# Patient Record
Sex: Female | Born: 1935 | Race: White | Hispanic: No | Smoking: Never smoker
Health system: Southern US, Community
[De-identification: ages and names within clinical notes are randomized; demographics above are authoritative.]

## PROBLEM LIST (undated history)

## (undated) DIAGNOSIS — I10 Essential (primary) hypertension: Secondary | ICD-10-CM

## (undated) DIAGNOSIS — M069 Rheumatoid arthritis, unspecified: Secondary | ICD-10-CM

## (undated) DIAGNOSIS — E039 Hypothyroidism, unspecified: Secondary | ICD-10-CM

## (undated) DIAGNOSIS — G252 Other specified forms of tremor: Secondary | ICD-10-CM

## (undated) HISTORY — DX: Rheumatoid arthritis, unspecified: M06.9

## (undated) HISTORY — DX: Essential (primary) hypertension: I10

## (undated) HISTORY — DX: Hypothyroidism, unspecified: E03.9

## (undated) HISTORY — DX: Other specified forms of tremor: G25.2

## (undated) HISTORY — PX: CATARACT EXTRACTION: SUR2

## (undated) HISTORY — PX: OTHER SURGICAL HISTORY: SHX169

## (undated) HISTORY — PX: DILATION AND CURETTAGE OF UTERUS: SHX78

---

## 2013-10-04 ENCOUNTER — Encounter: Payer: Self-pay | Admitting: Neurology

## 2013-10-04 ENCOUNTER — Ambulatory Visit (INDEPENDENT_AMBULATORY_CARE_PROVIDER_SITE_OTHER): Payer: Medicare Other | Admitting: Neurology

## 2013-10-04 ENCOUNTER — Encounter (INDEPENDENT_AMBULATORY_CARE_PROVIDER_SITE_OTHER): Payer: Self-pay

## 2013-10-04 VITALS — BP 160/71 | HR 74 | Ht 59.0 in | Wt 144.0 lb

## 2013-10-04 DIAGNOSIS — G252 Other specified forms of tremor: Secondary | ICD-10-CM

## 2013-10-04 DIAGNOSIS — G250 Essential tremor: Secondary | ICD-10-CM

## 2013-10-04 DIAGNOSIS — M069 Rheumatoid arthritis, unspecified: Secondary | ICD-10-CM

## 2013-10-04 NOTE — Progress Notes (Signed)
PATIENT: Virginia Wright DOB: [birth year]  HISTORICAL  Virginia Wright is accompanied by her son for followup for essential tremor I evaluated her in February 2014,   She is a 80 year old right-handed Caucasian female, accompanied by her son referred by her primary care physician Virginia Wright,  for evaluation of bilateral hands tremor  She has past medical history of rheumatoid arthritis, on chronic methotrexate 2.5 mg 2 tablets every week, hypothyroidism, on supplement, history of kidney stone,  She presenting with more than 5 years history of bilateral hands tremor, most noticeable when she writes, using utensil, holding a cup, she denied difficulty walking, denies lost sense of smell, no constipation, no REM sleep disorder, no orthostatic symptoms,   Her brother at age 103, also had similar symptoms,.her adult son is asymptomatic, her parents died at young age,  She is hypertensive, is taking amlodipine 10 mg every day, today's blood pressure is 142 / 80, HR 60   She suffered a kidney stone in 12/21/2011, now recovered well, she is tolerating propanolol 40 mg twice a day, reported mild improvement, she's sleeping well, no REM sleeping disorder, no loss of sense of smell, tremor bothers her when she writes, or holding a cup   She is taking propanolol 40 mg twice a day, doing very well, which does help her bilateral hands tremor mild to moderately, she has no gait difficulty, no loss sense of smell     REVIEW OF SYSTEMS: Full 14 system review of systems performed and notable only for bilateral hands tremor   ALLERGIES: No Known Allergies  HOME MEDICATIONS: No outpatient prescriptions prior to visit.   No facility-administered medications prior to visit.    PAST MEDICAL HISTORY: Past Medical History  Diagnosis Date  . Unspecified hypothyroidism   . Arthritis, rheumatoid   . Postural tremor   . High blood pressure     PAST SURGICAL HISTORY: Past Surgical History  Procedure  Laterality Date  . Dilation and curettage of uterus    . Kidney stones    . Cataract extraction Bilateral     FAMILY HISTORY: History reviewed. No pertinent family history.  SOCIAL HISTORY:  History   Social History  . Marital Status: Widowed    Spouse Name: N/A    Number of Children: 1  . Years of Education: 12   Occupational History  .      retired   Social History Main Topics  . Smoking status: Never Smoker   . Smokeless tobacco: Never Used  . Alcohol Use: No  . Drug Use: No  . Sexual Activity: Not on file   Other Topics Concern  . Not on file   Social History Narrative   Patient lives at home alone.   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   Generalized: In no acute distress  Neck: Supple, no carotid bruits   Cardiac: Regular rate rhythm  Pulmonary: Clear to auscultation bilaterally  Musculoskeletal: No deformity  Neurological examination  Mentation: Alert oriented to time, place, history taking, and causual conversation  Cranial nerve II-XII: Pupils were equal round reactive to light extraocular movements were full, Visual field were full on  confrontational test. Bilateral fundi were sharp.  Facial sensation and strength were normal. Hearing was intact to finger rubbing bilaterally. Uvula tongue midline.  head turning and shoulder shrug and were normal and symmetric.Tongue protrusion into cheek strength was normal.  Motor: normal tone, bulk and strength, with exception of mild bilateral hands pasture tremor, she also has no-no head shaking     Sensory: Intact to fine touch, pinprick, preserved vibratory sensation, and proprioception at toes.  Coordination: Normal finger to nose, heel-to-shin bilaterally there was no  truncal ataxia  Gait: Rising up from seated position without assistance, normal stance, without trunk ataxia, moderate stride, good arm swing, smooth turning, able to perform tiptoe, and heel walking without difficulty.   Romberg signs: Negative  Deep tendon reflexes: Brachioradialis 2/2, biceps 2/2, triceps 2/2, patellar 2/2, Achilles 2/2, plantar responses were flexor bilaterally.   DIAGNOSTIC DATA (LABS, IMAGING, TESTING) - I reviewed patient records, labs, notes, testing and imaging myself where available.   ASSESSMENT AND PLAN    80 years old right-handed Caucasian female, with family history of essential tremor, she also has mild bilateral hands shaking, head titubation, responding mild rapidly to propanolol 40 mg twice a day, her blood pressure is stable, there was no significant side effects, refuel propanolol, continual focal out with her primary care, on the return to primary care if new issue arise      Levert Feinstein, M.D. Ph.D.  [facility] [address] [place], [place] [address] [phone]

## 2013-11-04 ENCOUNTER — Other Ambulatory Visit: Payer: Self-pay | Admitting: Neurology

## 2016-07-02 ENCOUNTER — Encounter: Payer: Self-pay | Admitting: Oncology

## 2016-08-27 ENCOUNTER — Ambulatory Visit (HOSPITAL_BASED_OUTPATIENT_CLINIC_OR_DEPARTMENT_OTHER): Payer: Medicare Other | Admitting: Oncology

## 2016-08-27 ENCOUNTER — Encounter: Payer: Self-pay | Admitting: Oncology

## 2016-08-27 DIAGNOSIS — D696 Thrombocytopenia, unspecified: Secondary | ICD-10-CM

## 2016-08-27 DIAGNOSIS — M069 Rheumatoid arthritis, unspecified: Secondary | ICD-10-CM | POA: Diagnosis not present

## 2016-08-27 NOTE — Progress Notes (Signed)
Reason for Referral:  Thrombocytopenia.   HPI: 83 year old woman with history of rheumatoid arthritis and hypothyroidism but no other significant comorbid conditions. She has been dealing with rheumatoid arthritis for over 10 years and at some point she was on methotrexate. She has been off methotrexate given to her disease control and she was told that she is no longer requiring it. She was noted to have thrombocytopenia on a CBC in November 2017. At that time her white cell count was 4.7, hemoglobin 13.2 and a platelet count of 95. Previous CBCs dating back to 2014 has shown similar pattern. Platelet count has fluctuated between as low as 88 and as high as 115 with normal CBC otherwise. She did have a rash on her lower extremities that was treated by dermatology and no ecchymosis noted. She denies any bleeding complications. She denied any epistaxis, hematochezia or melena. She denied any constitutional symptoms of weight loss or appetite changes. She remains active and lives independently. She attends to activities of daily living including driving short distances. He not any falls or syncope.  She does not report any headaches, blurry vision, syncope or seizures. She does not report any fevers, chills, sweats or weight loss. She does not report any chest pain, palpitation, orthopnea or leg edema. She does not report any cough, wheezing or hemoptysis. She does not report any nausea, vomiting or abdominal pain. She does not report any abdominal distention or early satiety. She does not report any frequency urgency or hesitancy. She is not report any skeletal complaints of arthralgias or myalgias. Remaining review of systems unremarkable.   Past Medical History:  Diagnosis Date  . Arthritis, rheumatoid   . High blood pressure   . Postural tremor   . Unspecified hypothyroidism   :  Past Surgical History:  Procedure Laterality Date  . CATARACT EXTRACTION Bilateral   . DILATION AND CURETTAGE OF  UTERUS    . kidney stones    :   Current Outpatient Prescriptions:  .  albuterol (PROVENTIL HFA;VENTOLIN HFA) 108 (90 Base) MCG/ACT inhaler, Inhale 2 puffs into the lungs 4 (four) times daily as needed., Disp: , Rfl:  .  amLODipine (NORVASC) 10 MG tablet, 10 mg daily., Disp: , Rfl:  .  Calcium-Vitamin D 600-200 MG-UNIT tablet, Take 1 tablet by mouth daily., Disp: , Rfl:  .  folic acid (FOLVITE) 1 MG tablet, Take 1 tablet by mouth daily., Disp: , Rfl:  .  propranolol (INDERAL) 40 MG tablet, TAKE ONE TABLET BY MOUTH TWICE DAILY, Disp: 180 tablet, Rfl: 4 .  SYNTHROID 75 MCG tablet, Take 75 mcg by mouth daily., Disp: , Rfl: :  Allergies  Allergen Reactions  . Celecoxib Other (See Comments)    Other reaction(s): SWELLING/EDEMA  . Clarithromycin Other (See Comments)    Patient doesn't remember reaction  . Cefdinir Rash  . Codeine Nausea Only  . Gatifloxacin Other (See Comments)    Patient doesn't remember reaction  . Hydroxychloroquine Other (See Comments)    Patient doesn't remember reaction  :  No family history on file.:  Social History   Social History  . Marital status: Widowed    Spouse name: N/A  . Number of children: 1  . Years of education: 62   Occupational History  .      retired   Social History Main Topics  . Smoking status: Never Smoker  . Smokeless tobacco: Never Used  . Alcohol use No  . Drug use: No  . Sexual activity:  Not on file   Other Topics Concern  . Not on file   Social History Narrative   Patient lives at home alone.   Retired   Ambulance person school   Caffeine one cup of coffee daily   Right handed  :  Pertinent items are noted in HPI.  Exam: Blood pressure (!) 164/48, pulse (!) 55, temperature 97.6 F (36.4 C), temperature source Oral, resp. rate 17, height '4\' 11"'$  (1.499 m), weight 153 lb 1.6 oz (69.4 kg), SpO2 100 %.  ECOG 1 General appearance: alert and cooperative appeared without distress. Head: Normocephalic, without obvious  abnormality Throat: lips, mucosa, and tongue normal; teeth and gums normal no oral ulcers or ecchymosis. Neck: no adenopathy Back: negative Resp: clear to auscultation bilaterally Cardio: regular rate and rhythm, S1, S2 normal, no murmur, click, rub or gallop GI: soft, non-tender; bowel sounds normal; no masses,  no organomegaly Extremities: extremities normal, atraumatic, no cyanosis or edema Pulses: 2+ and symmetric    Assessment and Plan:   83 year old woman with the following issues:  1. Thrombocytopenia noted as far back as 2014. Her platelet count has fluctuated as low as 88,000 and as high as 115,000. She had a normal CBC otherwise. Her most recent CBC in November 2017 showed a platelet count of 95,000. She does have rheumatoid arthritis and had been on methotrexate although methotrexate has been discontinued and the platelets are not changed.  The differential diagnosis was reviewed today with the patient and her son. The most likely etiology in this setting would be autoimmune thrombocytopenia. Condition like ITP especially in the setting of autoimmune disorder such as rheumatoid arthritis would be reasonably common. Medication affect is also a consideration although her medication were reviewed today and she is no longer on methotrexate or any other agent that can cause thrombocytopenia.  Condition such as DIC, HUS, TTP, HIT considered less likely. Primary bone marrow disorder such as myelodysplastic syndrome would be a possibility but I think is less likely at this time.  Her management standpoint, given the fact that her platelet count is adequate without any bleeding symptoms and no intervention is needed at this time.  Of her platelet counts drop below 50,000, a trial of steroid may be needed and possibly a bone marrow biopsy at that time. For the time being, as long as her platelets remain at baseline I do not think any intervention is needed.  2. Follow-up: I'm happy to see  her in the future as needed specially if her platelet counts drop below 50,000 or she develops any other cytopenias.

## 2016-12-03 ENCOUNTER — Encounter: Payer: Self-pay | Admitting: *Deleted

## 2019-05-17 ENCOUNTER — Emergency Department (HOSPITAL_COMMUNITY): Payer: Medicare Other

## 2019-05-17 ENCOUNTER — Inpatient Hospital Stay (HOSPITAL_COMMUNITY)
Admission: EM | Admit: 2019-05-17 | Discharge: 2019-05-22 | DRG: 481 | Disposition: A | Discharge status: Skilled Nursing Facility | Payer: Medicare Other | Attending: Internal Medicine | Admitting: Internal Medicine

## 2019-05-17 DIAGNOSIS — E559 Vitamin D deficiency, unspecified: Secondary | ICD-10-CM | POA: Diagnosis present

## 2019-05-17 DIAGNOSIS — Z79899 Other long term (current) drug therapy: Secondary | ICD-10-CM | POA: Diagnosis not present

## 2019-05-17 DIAGNOSIS — Z79890 Hormone replacement therapy (postmenopausal): Secondary | ICD-10-CM | POA: Diagnosis not present

## 2019-05-17 DIAGNOSIS — S72142A Displaced intertrochanteric fracture of left femur, initial encounter for closed fracture: Principal | ICD-10-CM | POA: Diagnosis present

## 2019-05-17 DIAGNOSIS — S72002A Fracture of unspecified part of neck of left femur, initial encounter for closed fracture: Secondary | ICD-10-CM | POA: Diagnosis not present

## 2019-05-17 DIAGNOSIS — D696 Thrombocytopenia, unspecified: Secondary | ICD-10-CM | POA: Diagnosis present

## 2019-05-17 DIAGNOSIS — E8889 Other specified metabolic disorders: Secondary | ICD-10-CM | POA: Diagnosis present

## 2019-05-17 DIAGNOSIS — M25552 Pain in left hip: Secondary | ICD-10-CM | POA: Diagnosis present

## 2019-05-17 DIAGNOSIS — E039 Hypothyroidism, unspecified: Secondary | ICD-10-CM | POA: Diagnosis present

## 2019-05-17 DIAGNOSIS — N179 Acute kidney failure, unspecified: Secondary | ICD-10-CM | POA: Diagnosis present

## 2019-05-17 DIAGNOSIS — Z419 Encounter for procedure for purposes other than remedying health state, unspecified: Secondary | ICD-10-CM

## 2019-05-17 DIAGNOSIS — Y93H2 Activity, gardening and landscaping: Secondary | ICD-10-CM

## 2019-05-17 DIAGNOSIS — S7222XA Displaced subtrochanteric fracture of left femur, initial encounter for closed fracture: Secondary | ICD-10-CM | POA: Diagnosis present

## 2019-05-17 DIAGNOSIS — K5900 Constipation, unspecified: Secondary | ICD-10-CM | POA: Diagnosis not present

## 2019-05-17 DIAGNOSIS — E875 Hyperkalemia: Secondary | ICD-10-CM | POA: Diagnosis present

## 2019-05-17 DIAGNOSIS — S72009A Fracture of unspecified part of neck of unspecified femur, initial encounter for closed fracture: Secondary | ICD-10-CM | POA: Diagnosis present

## 2019-05-17 DIAGNOSIS — Y92017 Garden or yard in single-family (private) house as the place of occurrence of the external cause: Secondary | ICD-10-CM | POA: Diagnosis not present

## 2019-05-17 DIAGNOSIS — W1839XA Other fall on same level, initial encounter: Secondary | ICD-10-CM | POA: Diagnosis present

## 2019-05-17 DIAGNOSIS — Z20828 Contact with and (suspected) exposure to other viral communicable diseases: Secondary | ICD-10-CM | POA: Diagnosis present

## 2019-05-17 DIAGNOSIS — M069 Rheumatoid arthritis, unspecified: Secondary | ICD-10-CM | POA: Diagnosis present

## 2019-05-17 DIAGNOSIS — I10 Essential (primary) hypertension: Secondary | ICD-10-CM

## 2019-05-17 DIAGNOSIS — D62 Acute posthemorrhagic anemia: Secondary | ICD-10-CM | POA: Diagnosis not present

## 2019-05-17 DIAGNOSIS — R7989 Other specified abnormal findings of blood chemistry: Secondary | ICD-10-CM

## 2019-05-17 LAB — CBC WITH DIFFERENTIAL/PLATELET
Abs Immature Granulocytes: 0.03 10*3/uL (ref 0.00–0.07)
Basophils Absolute: 0 10*3/uL (ref 0.0–0.1)
Basophils Relative: 0 %
Eosinophils Absolute: 0 10*3/uL (ref 0.0–0.5)
Eosinophils Relative: 0 %
HCT: 37.2 % (ref 36.0–46.0)
Hemoglobin: 13.1 g/dL (ref 12.0–15.0)
Immature Granulocytes: 1 %
Lymphocytes Relative: 10 %
Lymphs Abs: 0.7 10*3/uL (ref 0.7–4.0)
MCH: 37.6 pg — ABNORMAL HIGH (ref 26.0–34.0)
MCHC: 35.2 g/dL (ref 30.0–36.0)
MCV: 106.9 fL — ABNORMAL HIGH (ref 80.0–100.0)
Monocytes Absolute: 0.7 10*3/uL (ref 0.1–1.0)
Monocytes Relative: 12 %
Neutro Abs: 4.9 10*3/uL (ref 1.7–7.7)
Neutrophils Relative %: 77 %
Platelets: 104 10*3/uL — ABNORMAL LOW (ref 150–400)
RBC: 3.48 MIL/uL — ABNORMAL LOW (ref 3.87–5.11)
RDW: 14.4 % (ref 11.5–15.5)
WBC: 6.3 10*3/uL (ref 4.0–10.5)
nRBC: 0 % (ref 0.0–0.2)

## 2019-05-17 LAB — COMPREHENSIVE METABOLIC PANEL
ALT: 19 U/L (ref 0–44)
AST: 19 U/L (ref 15–41)
Albumin: 4.1 g/dL (ref 3.5–5.0)
Alkaline Phosphatase: 49 U/L (ref 38–126)
Anion gap: 10 (ref 5–15)
BUN: 27 mg/dL — ABNORMAL HIGH (ref 8–23)
CO2: 22 mmol/L (ref 22–32)
Calcium: 9 mg/dL (ref 8.9–10.3)
Chloride: 106 mmol/L (ref 98–111)
Creatinine, Ser: 1.23 mg/dL — ABNORMAL HIGH (ref 0.44–1.00)
GFR calc Af Amer: 47 mL/min — ABNORMAL LOW (ref >60)
GFR calc non Af Amer: 40 mL/min — ABNORMAL LOW (ref >60)
Glucose, Bld: 158 mg/dL — ABNORMAL HIGH (ref 70–99)
Potassium: 5.2 mmol/L — ABNORMAL HIGH (ref 3.5–5.1)
Sodium: 138 mmol/L (ref 135–145)
Total Bilirubin: 1.4 mg/dL — ABNORMAL HIGH (ref 0.3–1.2)
Total Protein: 6.4 g/dL — ABNORMAL LOW (ref 6.5–8.1)

## 2019-05-17 LAB — PROTIME-INR
INR: 1 (ref 0.8–1.2)
Prothrombin Time: 13.5 seconds (ref 11.4–15.2)

## 2019-05-17 MED ORDER — ONDANSETRON HCL 4 MG/2ML IJ SOLN
4.0000 mg | Freq: Once | INTRAMUSCULAR | Status: AC
  Administered 2019-05-17: 4 mg via INTRAVENOUS
  Filled 2019-05-17: qty 2

## 2019-05-17 MED ORDER — MORPHINE SULFATE (PF) 4 MG/ML IV SOLN
4.0000 mg | Freq: Once | INTRAVENOUS | Status: AC
  Administered 2019-05-17: 4 mg via INTRAVENOUS
  Filled 2019-05-17: qty 1

## 2019-05-17 NOTE — Consult Note (Signed)
Reason for Consult: Left hip pain Referring Physician: Dr. Lillette Boxer Bevard is an 85 y.o. female.  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  She had a mechanical fall tonight and reports left hip pain.  Denies any other orthopedic complaints.  Denies any loss of consciousness.  Her son is with her here.  Past Medical History:  Diagnosis Date  . Arthritis, rheumatoid ([place])   . High blood pressure   . Postural tremor   . Rheumatoid arthritis ([place])   . Unspecified hypothyroidism     Past Surgical History:  Procedure Laterality Date  . CATARACT EXTRACTION Bilateral   . DILATION AND CURETTAGE OF UTERUS    . kidney stones      No family history on file.  Social History:  reports that she has never smoked. She has never used smokeless tobacco. She reports that she does not drink alcohol or use drugs.  Allergies:  Allergies  Allergen Reactions  . Celecoxib Other (See Comments)    Other reaction(s): SWELLING/EDEMA  . Clarithromycin Other (See Comments)    Patient doesn't remember reaction  . Cefdinir Rash  . Codeine Nausea Only  . Gatifloxacin Other (See Comments)    Patient doesn't remember reaction  . Hydroxychloroquine Other (See Comments)    Patient doesn't remember reaction    Medications: I have reviewed the patient's current medications.  Results for orders placed or performed during the hospital encounter of 05/17/19 (from the past 48 hour(s))  Comprehensive metabolic panel     Status: Abnormal   Collection Time: 05/17/19  9:01 PM  Result Value Ref Range   Sodium 138 135 - 145 mmol/L   Potassium 5.2 (H) 3.5 - 5.1 mmol/L   Chloride 106 98 - 111 mmol/L   CO2 22 22 - 32 mmol/L   Glucose, Bld 158 (H) 70 - 99 mg/dL   BUN 27 (H) 8 - 23 mg/dL   Creatinine, Ser 1.23 (H) 0.44 - 1.00 mg/dL   Calcium 9.0 8.9 - 10.3 mg/dL   Total Protein 6.4 (L) 6.5 - 8.1 g/dL   Albumin 4.1 3.5 - 5.0 g/dL   AST 19 15 - 41 U/L   ALT 19 0 - 44  U/L   Alkaline Phosphatase 49 38 - 126 U/L   Total Bilirubin 1.4 (H) 0.3 - 1.2 mg/dL   GFR calc non Af Amer 40 (L) >60 mL/min   GFR calc Af Amer 47 (L) >60 mL/min   Anion gap 10 5 - 15    Comment: Performed at [facility], [address], [place], [place] [address]  CBC with Differential     Status: Abnormal   Collection Time: 05/17/19  9:01 PM  Result Value Ref Range   WBC 6.3 4.0 - 10.5 K/uL   RBC 3.48 (L) 3.87 - 5.11 MIL/uL   Hemoglobin 13.1 12.0 - 15.0 g/dL   HCT 37.2 36.0 - 46.0 %   MCV 106.9 (H) 80.0 - 100.0 fL   MCH 37.6 (H) 26.0 - 34.0 pg   MCHC 35.2 30.0 - 36.0 g/dL   RDW 14.4 11.5 - 15.5 %   Platelets 104 (L) 150 - 400 K/uL    Comment: REPEATED TO VERIFY PLATELET COUNT CONFIRMED BY SMEAR Immature Platelet Fraction may be clinically indicated, consider ordering this additional test [id number]    nRBC 0.0 0.0 - 0.2 %   Neutrophils Relative % 77 %   Neutro Abs 4.9 1.7 -  7.7 K/uL   Lymphocytes Relative 10 %   Lymphs Abs 0.7 0.7 - 4.0 K/uL   Monocytes Relative 12 %   Monocytes Absolute 0.7 0.1 - 1.0 K/uL   Eosinophils Relative 0 %   Eosinophils Absolute 0.0 0.0 - 0.5 K/uL   Basophils Relative 0 %   Basophils Absolute 0.0 0.0 - 0.1 K/uL   Immature Granulocytes 1 %   Abs Immature Granulocytes 0.03 0.00 - 0.07 K/uL    Comment: Performed at [facility], [address], [place], [place] [address]  Protime-INR     Status: None   Collection Time: 05/17/19  9:01 PM  Result Value Ref Range   Prothrombin Time 13.5 11.4 - 15.2 seconds   INR 1.0 0.8 - 1.2    Comment: (NOTE) INR goal varies based on device and disease states. Performed at [facility], [address], [place], [place] [address]     Dg Chest 1 View  Result Date: 05/17/2019 CLINICAL DATA:  Fall with left hip fracture. EXAM: CHEST  1 VIEW COMPARISON:  Report from radiograph 08/31/2018, images not available. FINDINGS: The cardiomediastinal contours are unchanged. Stable borderline  cardiomegaly. Pulmonary vasculature is normal. No consolidation, pleural effusion, or pneumothorax. No acute osseous abnormalities are seen. IMPRESSION: 1. No acute abnormality. 2. Borderline cardiomegaly. Electronically Signed   By: Narda Rutherford M.D.   On: 05/17/2019 20:47   Dg Hip Unilat W Or Wo Pelvis 2-3 Views Left  Result Date: 05/17/2019 CLINICAL DATA:  Left hip pain after mechanical fall today. EXAM: DG HIP (WITH OR WITHOUT PELVIS) 2-3V LEFT COMPARISON:  None. FINDINGS: Comminuted and displaced left proximal femur fracture involving the lesser trochanter and subtrochanteric femur. There is apex lateral angulation and mild proximal migration of the femoral shaft. Femoral head remains seated. Pubic rami are intact. Degenerative change of the pubic symphysis and sacroiliac joints. IMPRESSION: Comminuted and displaced left proximal femur fracture involving the lesser trochanter and subtrochanteric femur. Electronically Signed   By: Narda Rutherford M.D.   On: 05/17/2019 20:45    Review of Systems  Musculoskeletal: Positive for joint pain.  All other systems reviewed and are negative.  Blood pressure 113/65, pulse 74, temperature (!) 97.5 F (36.4 C), temperature source Oral, resp. rate 16, SpO2 98 %. Physical Exam  Constitutional: She appears well-developed.  HENT:  Head: Normocephalic.  Eyes: Pupils are equal, round, and reactive to light.  Neck: Normal range of motion.  Cardiovascular: Normal rate.  Respiratory: Effort normal.  Neurological: She is alert.  Skin: Skin is warm.  Psychiatric: She has a normal mood and affect.  Examination of bilateral upper extremities demonstrates good range of motion wrist elbows and shoulders bilaterally.  Right lower extremity no effusion in the knee and good ankle dorsiflexion plantarflexion strength with no groin pain with internal and external rotation.  Left leg is held slightly externally rotated and flexed but has good fetal pulses and  perfusion to the left foot with good ankle dorsiflexion plantarflexion strength.  Assessment/Plan: Impression is subtrochanteric femur fracture on the left.  Plan is left femur fracture intramedullary nailing.  Dr. Carola Frost has time available tomorrow and can do this sooner than I would be able to.  Plan for medical admission tonight and surgery tomorrow. Marrianne Mood Charbel Los 05/17/2019, 10:49 PM

## 2019-05-17 NOTE — ED Triage Notes (Signed)
Pt BIB [facility] following a mechanical ground level fall that occurred this afternoon. Per EMS patient had no loss of consciousness and is complaining of left thigh and hip pain. EMS reports patient leg is shortened and did palpate deformities, does have pedal pulses.  EMS meds given: 4mg  morphine 1mg  dilaudid 4mg  zofran 25 mg phenergan

## 2019-05-17 NOTE — H&P (Signed)
History and Physical    Virginia Wright HYW:[id number] DOB: [birth year] DOA: 05/17/2019  PCP: Chilton Greathouse, NP  Chief Complaint: Fall  HPI: Virginia Wright is a 85 y.o. female with medical history significant of rheumatoid arthritis, chronic thrombocytopenia, hypertension, hypothyroidism presenting to the hospital via EMS for evaluation of left hip and thigh pain after a fall.  Patient states that she was in her usual state of health and fell while doing gardening in her yard.  She was pulling on a honeysuckle wine and it broke which made her fall.  Since the fall she is having pain in her left hip.  Denies any head injury or neck pain.  Denies any injury anywhere else.  Denies chest pain, palpitations, shortness of breath, or lightheadedness/dizziness at the time of the fall.  No other complaints.  ED Course: Vital signs stable.  No leukocytosis.  No anemia.  Platelet count 104,000, has a history of chronic thrombocytopenia.  Potassium 5.2.  BUN 27, creatinine 1.2.  Baseline creatinine 0.9-1.0.  T bili 1.4, remainder of LFTs normal.   T bili was previously normal on labs done 9/15 per care everywhere.  SARS-CoV-2 test pending.  Chest x-ray showing borderline cardiomegaly and no acute abnormality.  X-ray of left hip and pelvis showing comminuted and displaced left proximal femur fracture involving the lesser trochanter and subtrochanteric femur.  Review of Systems:  All systems reviewed and apart from history of presenting illness, are negative.  Past Medical History:  Diagnosis Date   Arthritis, rheumatoid (HCC)    High blood pressure    Postural tremor    Rheumatoid arthritis (HCC)    Unspecified hypothyroidism     Past Surgical History:  Procedure Laterality Date   CATARACT EXTRACTION Bilateral    DILATION AND CURETTAGE OF UTERUS     kidney stones       reports that she has never smoked. She has never used smokeless tobacco. She reports that she does not drink alcohol or use  drugs.  Allergies  Allergen Reactions   Celecoxib Other (See Comments)    Other reaction(s): SWELLING/EDEMA   Clarithromycin Other (See Comments)    Patient doesn't remember reaction   Cefdinir Rash   Codeine Nausea Only   Gatifloxacin Other (See Comments)    Patient doesn't remember reaction   Hydroxychloroquine Other (See Comments)    Patient doesn't remember reaction    No family history on file.  Prior to Admission medications   Medication Sig Start Date End Date Taking? Authorizing Provider  albuterol (PROVENTIL HFA;VENTOLIN HFA) 108 (90 Base) MCG/ACT inhaler Inhale 2 puffs into the lungs 4 (four) times daily as needed. 01/08/16 01/07/17  [provider]  amLODipine (NORVASC) 10 MG tablet 10 mg daily. 09/30/13   [provider]  Calcium-Vitamin D 600-200 MG-UNIT tablet Take 1 tablet by mouth daily. 11/02/09   [provider]  folic acid (FOLVITE) 1 MG tablet Take 1 tablet by mouth daily. 06/06/16   [provider]  propranolol (INDERAL) 40 MG tablet TAKE ONE TABLET BY MOUTH TWICE DAILY 11/04/13   Levert Feinstein, MD  SYNTHROID 75 MCG tablet Take 75 mcg by mouth daily. 09/16/13   [provider]    Physical Exam: Vitals:   05/18/19 0130 05/18/19 0145 05/18/19 0200 05/18/19 0215  BP: (!) 129/57 116/64 (!) 120/58 (!) 132/56  Pulse:      Resp: 16 15 16 15   Temp:      TempSrc:      SpO2:  Physical Exam  Constitutional: She is oriented to person, place, and time. She appears well-developed and well-nourished. No distress.  HENT:  Head: Normocephalic.  Eyes: Right eye exhibits no discharge. Left eye exhibits no discharge.  Neck: Neck supple.  Cardiovascular: Normal rate, regular rhythm and intact distal pulses.  Pulmonary/Chest: Effort normal and breath sounds normal. No respiratory distress. She has no wheezes. She has no rales.  Abdominal: Soft. Bowel sounds are normal. She exhibits no distension. There is no abdominal  tenderness. There is no guarding.  Musculoskeletal:        General: No edema.     Comments: Dorsalis pedis pulse intact bilaterally Left lower extremity shortened and externally rotated  Neurological: She is alert and oriented to person, place, and time.  Skin: Skin is warm and dry. She is not diaphoretic.     Labs on Admission: I have personally reviewed following labs and imaging studies  CBC: Recent Labs  Lab 05/17/19 2101  WBC 6.3  NEUTROABS 4.9  HGB 13.1  HCT 37.2  MCV 106.9*  PLT 161*   Basic Metabolic Panel: Recent Labs  Lab 05/17/19 2101  NA 138  K 5.2*  CL 106  CO2 22  GLUCOSE 158*  BUN 27*  CREATININE 1.23*  CALCIUM 9.0   GFR: CrCl cannot be calculated (Unknown ideal weight.). Liver Function Tests: Recent Labs  Lab 05/17/19 2101  AST 19  ALT 19  ALKPHOS 49  BILITOT 1.4*  PROT 6.4*  ALBUMIN 4.1   No results for input(s): LIPASE, AMYLASE in the last 168 hours. No results for input(s): AMMONIA in the last 168 hours. Coagulation Profile: Recent Labs  Lab 05/17/19 2101  INR 1.0   Cardiac Enzymes: No results for input(s): CKTOTAL, CKMB, CKMBINDEX, TROPONINI in the last 168 hours. BNP (last 3 results) No results for input(s): PROBNP in the last 8760 hours. HbA1C: No results for input(s): HGBA1C in the last 72 hours. CBG: No results for input(s): GLUCAP in the last 168 hours. Lipid Profile: No results for input(s): CHOL, HDL, LDLCALC, TRIG, CHOLHDL, LDLDIRECT in the last 72 hours. Thyroid Function Tests: No results for input(s): TSH, T4TOTAL, FREET4, T3FREE, THYROIDAB in the last 72 hours. Anemia Panel: No results for input(s): VITAMINB12, FOLATE, FERRITIN, TIBC, IRON, RETICCTPCT in the last 72 hours. Urine analysis: No results found for: COLORURINE, APPEARANCEUR, LABSPEC, PHURINE, GLUCOSEU, HGBUR, BILIRUBINUR, KETONESUR, PROTEINUR, UROBILINOGEN, NITRITE, LEUKOCYTESUR  Radiological Exams on Admission: Dg Chest 1 View  Result Date:  05/17/2019 CLINICAL DATA:  Fall with left hip fracture. EXAM: CHEST  1 VIEW COMPARISON:  Report from radiograph 08/31/2018, images not available. FINDINGS: The cardiomediastinal contours are unchanged. Stable borderline cardiomegaly. Pulmonary vasculature is normal. No consolidation, pleural effusion, or pneumothorax. No acute osseous abnormalities are seen. IMPRESSION: 1. No acute abnormality. 2. Borderline cardiomegaly. Electronically Signed   By: Keith Rake M.D.   On: 05/17/2019 20:47   Dg Hip Unilat W Or Wo Pelvis 2-3 Views Left  Result Date: 05/17/2019 CLINICAL DATA:  Left hip pain after mechanical fall today. EXAM: DG HIP (WITH OR WITHOUT PELVIS) 2-3V LEFT COMPARISON:  None. FINDINGS: Comminuted and displaced left proximal femur fracture involving the lesser trochanter and subtrochanteric femur. There is apex lateral angulation and mild proximal migration of the femoral shaft. Femoral head remains seated. Pubic rami are intact. Degenerative change of the pubic symphysis and sacroiliac joints. IMPRESSION: Comminuted and displaced left proximal femur fracture involving the lesser trochanter and subtrochanteric femur. Electronically Signed   By: Threasa Beards  Sanford M.D.   On: 05/17/2019 20:45    EKG: Independently reviewed.  Sinus rhythm, RBBB, LAFB.  No prior EKG for comparison.  Assessment/Plan Principal Problem:   Hip fracture (HCC) Active Problems:   Thrombocytopenia (HCC)   Elevated serum creatinine   Serum total bilirubin elevated   Essential hypertension   Left hip fracture secondary to a mechanical fall X-ray of left hip and pelvis showing comminuted and displaced left proximal femur fracture involving the lesser trochanter and subtrochanteric femur. -ED provider discussed the case with Dr. August Saucerean from orthopedics, patient will be taken to the OR in the morning. -Pain management: Morphine 0.5 mg every 2 hours as needed, Norco 5-325 mg 1 to 2 tablets every 6 hours as needed -N.p.o.  except sips with meds  Chronic thrombocytopenia Last seen by Dr. Clelia CroftShadad in November 2018.  Platelet count 104,000.  No signs of active bleeding. -Ensure hematology follow-up  Borderline hyperkalemia Potassium 5.2.  Not on an ACE inhibitor or potassium sparing diuretic. -Repeat BMP in a.m.  Mild elevation of creatinine BUN 27, creatinine 1.2.  Baseline creatinine 0.9-1.0.   -Currently n.p.o.  Gentle IV fluid hydration. -Repeat BMP in a.m.  Mildly elevated T bili T bili 1.4, remainder of LFTs normal.  T bili was previously normal on labs done 9/15 per care everywhere.   -Check fractionated bilirubin level  Hypertension -Currently normotensive.  Continue home propranolol.  Hold home amlodipine at this time.  Hypothyroidism -Continue home Synthroid  DVT prophylaxis: SCDs at this time Code Status: Patient wishes to be full code. Family Communication: Son at bedside. Disposition Plan: Anticipate discharge after clinical improvement. Consults called: Orthopedics Admission status: It is my clinical opinion that admission to INPATIENT is reasonable and necessary in this 85 y.o. female  presenting with left hip fracture secondary to a mechanical fall.  Will undergo surgery in the morning.  Given the aforementioned, the predictability of an adverse outcome is felt to be significant. I expect that the patient will require at least 2 midnights in the hospital to treat this condition.   The medical decision making on this patient was of high complexity and the patient is at high risk for clinical deterioration, therefore this is a level 3 visit.  John GiovanniVasundhra Andersyn Fragoso MD [facility] Pager [phone]  If 7PM-7AM, please contact night-coverage [email or web address] Password [email or web address]  05/18/2019, 4:04 AM

## 2019-05-17 NOTE — ED Notes (Signed)
Patient transported to X-ray 

## 2019-05-17 NOTE — ED Notes (Signed)
Ordered a hospital bed--Virginia Wright  

## 2019-05-17 NOTE — ED Provider Notes (Signed)
[facility] EM[facility]GENCY DEPARTMENT Provider Note   CSN: [id number] Arrival date & time: 05/17/19  1815     History   Chief Complaint Chief Complaint  Patient presents with  . Fall    HPI Anaika Santillano is a 85 y.o. female.     Patient is an 85 year old female with history of rheumatoid arthritis, hypothyroidism.  She presents today for evaluation of fall.  Patient was attempting to trim her bushes when she fell and injured her left hip.  She was apparently able to crawl back into the house and called 911.  Patient denies other injury.  The history is provided by the patient.  Fall This is a new problem. The current episode started less than 1 hour ago. The problem occurs constantly. The problem has not changed since onset.Exacerbated by: movement and palpation. Nothing relieves the symptoms. She has tried nothing for the symptoms.    Past Medical History:  Diagnosis Date  . Arthritis, rheumatoid ([place])   . High blood pressure   . Postural tremor   . Rheumatoid arthritis ([place])   . Unspecified hypothyroidism     Patient Active Problem List   Diagnosis Date Noted  . Thrombocytopenia ([place]) 08/27/2016  . Essential tremor 10/04/2013  . Arthritis, rheumatoid ([place])     Past Surgical History:  Procedure Laterality Date  . CATARACT EXTRACTION Bilateral   . DILATION AND CURETTAGE OF UTERUS    . kidney stones       OB History   No obstetric history on file.      Home Medications    Prior to Admission medications   Medication Sig Start Date End Date Taking? Authorizing Provider  albuterol (PROVENTIL HFA;VENTOLIN HFA) 108 (90 Base) MCG/ACT inhaler Inhale 2 puffs into the lungs 4 (four) times daily as needed. 01/08/16 01/07/17  [provider]  amLODipine (NORVASC) 10 MG tablet 10 mg daily. 09/30/13   [provider]  Calcium-Vitamin D 600-200 MG-UNIT tablet Take 1 tablet by mouth daily. 11/02/09   [provider]  folic acid  (FOLVITE) 1 MG tablet Take 1 tablet by mouth daily. 06/06/16   [provider]  propranolol (INDERAL) 40 MG tablet TAKE ONE TABLET BY MOUTH TWICE DAILY 11/04/13   Marcial Pacas, MD  SYNTHROID 75 MCG tablet Take 75 mcg by mouth daily. 09/16/13   [provider]    Family History No family history on file.  Social History Social History   Tobacco Use  . Smoking status: Never Smoker  . Smokeless tobacco: Never Used  Substance Use Topics  . Alcohol use: No  . Drug use: No     Allergies   Celecoxib, Clarithromycin, Cefdinir, Codeine, Gatifloxacin, and Hydroxychloroquine   Review of Systems Review of Systems  All other systems reviewed and are negative.    Physical Exam Updated Vital Signs Pulse 74   Temp (!) 97.5 F (36.4 C) (Oral)   Resp 16   SpO2 97%   Physical Exam Vitals signs and nursing note reviewed.  Constitutional:      General: She is not in acute distress.    Appearance: She is well-developed. She is not diaphoretic.  HENT:     Head: Normocephalic and atraumatic.  Neck:     Musculoskeletal: Normal range of motion and neck supple.  Cardiovascular:     Rate and Rhythm: Normal rate and regular rhythm.     Heart sounds: No murmur. No friction rub. No gallop.   Pulmonary:  Effort: Pulmonary effort is normal. No respiratory distress.     Breath sounds: Normal breath sounds. No wheezing.  Abdominal:     General: Bowel sounds are normal. There is no distension.     Palpations: Abdomen is soft.     Tenderness: There is no abdominal tenderness.  Musculoskeletal: Normal range of motion.     Comments: There is tenderness to palpation over the left lateral hip.  The leg is shortened and externally rotated.  DP pulses are easily palpable and motor and sensation are intact throughout both lower extremities.  Skin:    General: Skin is warm and dry.  Neurological:     Mental Status: She is alert and oriented to person, place, and time.      ED  Treatments / Results  Labs (all labs ordered are listed, but only abnormal results are displayed) Labs Reviewed  COMPREHENSIVE METABOLIC PANEL  CBC WITH DIFFERENTIAL/PLATELET  PROTIME-INR    EKG None  Radiology No results found.  Procedures Procedures (including critical care time)  Medications Ordered in ED Medications - No data to display   Initial Impression / Assessment and Plan / ED Course  I have reviewed the triage vital signs and the nursing notes.  Pertinent labs & imaging results that were available during my care of the patient were reviewed by me and considered in my medical decision making (see chart for details).  Patient presenting with complaints of left hip pain after a fall outside while trimming her bushes.  X-rays show an intertrochanteric and subtrochanteric fracture of the left hip.  This finding was discussed with Dr. August Saucer from orthopedics.  Patient to be admitted to the hospitalist service in anticipation of surgical repair tomorrow.  Final Clinical Impressions(s) / ED Diagnoses   Final diagnoses:  None    ED Discharge Orders    None       Geoffery Lyons, MD 05/17/19 2252

## 2019-05-17 NOTE — ED Notes (Signed)
Attempted to get blood, not successful.

## 2019-05-18 ENCOUNTER — Encounter (HOSPITAL_COMMUNITY)
Admission: EM | Disposition: A | Discharge status: Skilled Nursing Facility | Payer: Self-pay | Source: Home / Self Care | Attending: Family Medicine

## 2019-05-18 ENCOUNTER — Encounter (HOSPITAL_COMMUNITY): Payer: Self-pay | Admitting: Certified Registered"

## 2019-05-18 ENCOUNTER — Other Ambulatory Visit: Payer: Self-pay

## 2019-05-18 ENCOUNTER — Inpatient Hospital Stay (HOSPITAL_COMMUNITY): Payer: Medicare Other

## 2019-05-18 ENCOUNTER — Inpatient Hospital Stay (HOSPITAL_COMMUNITY): Payer: Medicare Other | Admitting: Certified Registered"

## 2019-05-18 DIAGNOSIS — R7989 Other specified abnormal findings of blood chemistry: Secondary | ICD-10-CM

## 2019-05-18 DIAGNOSIS — I10 Essential (primary) hypertension: Secondary | ICD-10-CM

## 2019-05-18 HISTORY — PX: INTRAMEDULLARY (IM) NAIL INTERTROCHANTERIC: SHX5875

## 2019-05-18 LAB — CBC
HCT: 27.3 % — ABNORMAL LOW (ref 36.0–46.0)
Hemoglobin: 9.3 g/dL — ABNORMAL LOW (ref 12.0–15.0)
MCH: 37.1 pg — ABNORMAL HIGH (ref 26.0–34.0)
MCHC: 34.1 g/dL (ref 30.0–36.0)
MCV: 108.8 fL — ABNORMAL HIGH (ref 80.0–100.0)
Platelets: 38 10*3/uL — ABNORMAL LOW (ref 150–400)
RBC: 2.51 MIL/uL — ABNORMAL LOW (ref 3.87–5.11)
RDW: 14.6 % (ref 11.5–15.5)
WBC: 8.8 10*3/uL (ref 4.0–10.5)
nRBC: 0 % (ref 0.0–0.2)

## 2019-05-18 LAB — BASIC METABOLIC PANEL
Anion gap: 7 (ref 5–15)
BUN: 25 mg/dL — ABNORMAL HIGH (ref 8–23)
CO2: 26 mmol/L (ref 22–32)
Calcium: 8.5 mg/dL — ABNORMAL LOW (ref 8.9–10.3)
Chloride: 106 mmol/L (ref 98–111)
Creatinine, Ser: 1.12 mg/dL — ABNORMAL HIGH (ref 0.44–1.00)
GFR calc Af Amer: 52 mL/min — ABNORMAL LOW (ref >60)
GFR calc non Af Amer: 45 mL/min — ABNORMAL LOW (ref >60)
Glucose, Bld: 156 mg/dL — ABNORMAL HIGH (ref 70–99)
Potassium: 4.8 mmol/L (ref 3.5–5.1)
Sodium: 139 mmol/L (ref 135–145)

## 2019-05-18 LAB — SARS CORONAVIRUS 2 BY RT PCR (HOSPITAL ORDER, PERFORMED IN ~~LOC~~ HOSPITAL LAB): SARS Coronavirus 2: NEGATIVE

## 2019-05-18 LAB — BILIRUBIN, FRACTIONATED(TOT/DIR/INDIR)
Bilirubin, Direct: 0.2 mg/dL (ref 0.0–0.2)
Indirect Bilirubin: 0.7 mg/dL (ref 0.3–0.9)
Total Bilirubin: 0.9 mg/dL (ref 0.3–1.2)

## 2019-05-18 LAB — SARS CORONAVIRUS 2 (TAT 6-24 HRS): SARS Coronavirus 2: NEGATIVE

## 2019-05-18 IMAGING — DX DG PORTABLE PELVIS
1 series · 1 of 1 positions shown · non-contrast
Comparison: [DATE]

CLINICAL DATA: Left hip fracture, fixation

EXAM:
PORTABLE PELVIS 1-2 VIEWS

[pelvis]
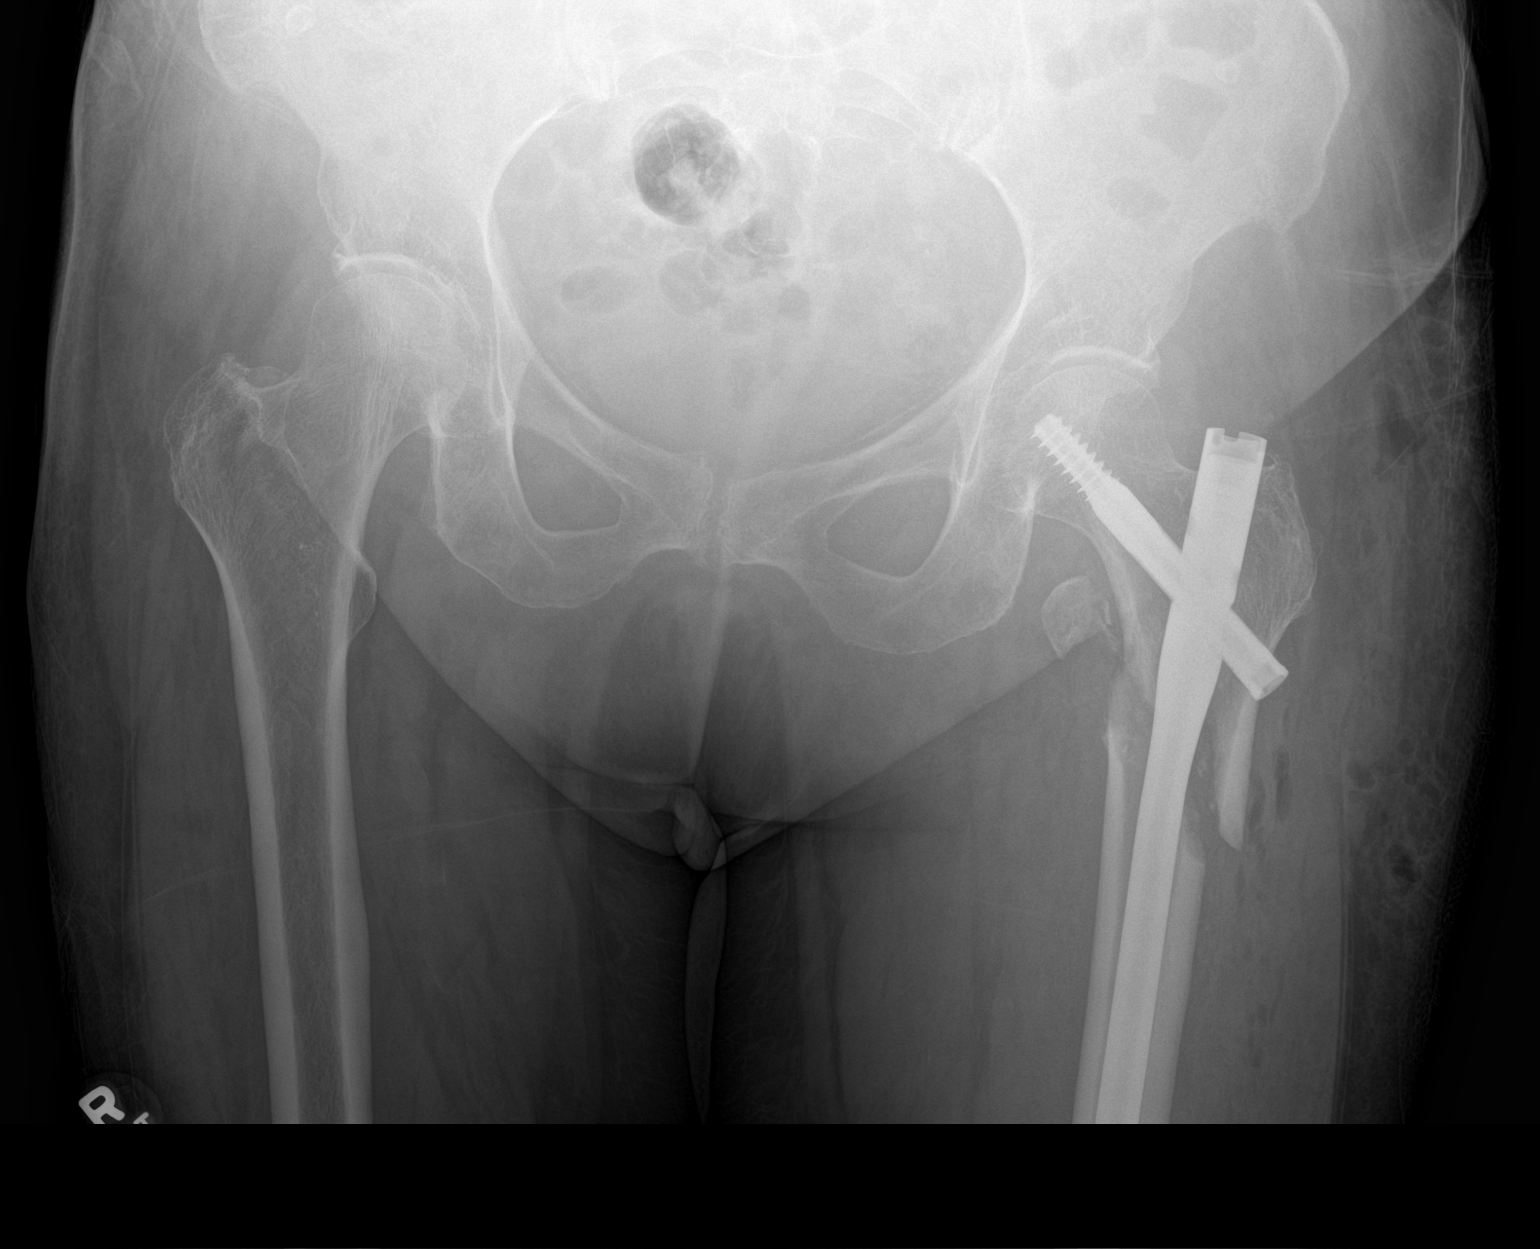

[1 of 1 positions shown; findings below may reference images not displayed]

FINDINGS: Internal fixation across the proximal left femoral shaft fracture.
Continued mild displacement of fracture fragments. No hardware
complicating feature.
IMPRESSION: Internal fixation as above.

## 2019-05-18 SURGERY — FIXATION, FRACTURE, INTERTROCHANTERIC, WITH INTRAMEDULLARY ROD
Anesthesia: General | Site: Hip | Laterality: Left

## 2019-05-18 MED ORDER — PHENYLEPHRINE 40 MCG/ML (10ML) SYRINGE FOR IV PUSH (FOR BLOOD PRESSURE SUPPORT)
PREFILLED_SYRINGE | INTRAVENOUS | Status: AC
  Filled 2019-05-18: qty 10

## 2019-05-18 MED ORDER — ALBUTEROL SULFATE (2.5 MG/3ML) 0.083% IN NEBU: 3.0000 mL | INHALATION_SOLUTION | RESPIRATORY_TRACT | Status: DC | PRN

## 2019-05-18 MED ORDER — MORPHINE SULFATE (PF) 2 MG/ML IV SOLN
0.5000 mg | INTRAVENOUS | Status: DC | PRN
  Administered 2019-05-20 (×4): 1 mg via INTRAVENOUS
  Filled 2019-05-18 (×4): qty 1

## 2019-05-18 MED ORDER — FOLIC ACID 1 MG PO TABS
1.0000 mg | ORAL_TABLET | Freq: Every day | ORAL | Status: DC
  Administered 2019-05-18 – 2019-05-22 (×5): 1 mg via ORAL
  Filled 2019-05-18 (×5): qty 1

## 2019-05-18 MED ORDER — PROPOFOL 10 MG/ML IV BOLUS
INTRAVENOUS | Status: DC | PRN
  Administered 2019-05-18: 120 mg via INTRAVENOUS

## 2019-05-18 MED ORDER — ACETAMINOPHEN 10 MG/ML IV SOLN
1000.0000 mg | Freq: Once | INTRAVENOUS | Status: DC | PRN
  Administered 2019-05-18: 17:00:00 1000 mg via INTRAVENOUS

## 2019-05-18 MED ORDER — FENTANYL CITRATE (PF) 250 MCG/5ML IJ SOLN
INTRAMUSCULAR | Status: AC
  Filled 2019-05-18: qty 5

## 2019-05-18 MED ORDER — ENOXAPARIN SODIUM 40 MG/0.4ML ~~LOC~~ SOLN
40.0000 mg | SUBCUTANEOUS | Status: DC
  Administered 2019-05-19: 08:00:00 40 mg via SUBCUTANEOUS
  Filled 2019-05-18: qty 0.4

## 2019-05-18 MED ORDER — HYDROCODONE-ACETAMINOPHEN 5-325 MG PO TABS
1.0000 | ORAL_TABLET | Freq: Four times a day (QID) | ORAL | Status: DC | PRN
  Administered 2019-05-18 – 2019-05-21 (×7): 2 via ORAL
  Filled 2019-05-18 (×7): qty 2

## 2019-05-18 MED ORDER — ONDANSETRON HCL 4 MG/2ML IJ SOLN
INTRAMUSCULAR | Status: DC | PRN
  Administered 2019-05-18: 4 mg via INTRAVENOUS

## 2019-05-18 MED ORDER — PHENYLEPHRINE 40 MCG/ML (10ML) SYRINGE FOR IV PUSH (FOR BLOOD PRESSURE SUPPORT)
PREFILLED_SYRINGE | INTRAVENOUS | Status: DC | PRN
  Administered 2019-05-18 (×2): 120 ug via INTRAVENOUS

## 2019-05-18 MED ORDER — FENTANYL CITRATE (PF) 100 MCG/2ML IJ SOLN
25.0000 ug | INTRAMUSCULAR | Status: DC | PRN
  Administered 2019-05-18 (×2): 25 ug via INTRAVENOUS

## 2019-05-18 MED ORDER — PHENOL 1.4 % MT LIQD: 1.0000 | OROMUCOSAL | Status: DC | PRN

## 2019-05-18 MED ORDER — ACETAMINOPHEN 500 MG PO TABS
500.0000 mg | ORAL_TABLET | Freq: Three times a day (TID) | ORAL | Status: DC
  Administered 2019-05-18 – 2019-05-19 (×3): 500 mg via ORAL
  Filled 2019-05-18 (×4): qty 1

## 2019-05-18 MED ORDER — PROPRANOLOL HCL 40 MG PO TABS
40.0000 mg | ORAL_TABLET | Freq: Two times a day (BID) | ORAL | Status: DC
  Administered 2019-05-18 – 2019-05-22 (×9): 40 mg via ORAL
  Filled 2019-05-18 (×11): qty 1

## 2019-05-18 MED ORDER — LACTATED RINGERS IV SOLN
INTRAVENOUS | Status: DC
  Administered 2019-05-18: 13:00:00 via INTRAVENOUS

## 2019-05-18 MED ORDER — POVIDONE-IODINE 10 % EX SWAB: 2.0000 "application " | Freq: Once | CUTANEOUS | Status: DC

## 2019-05-18 MED ORDER — CLINDAMYCIN PHOSPHATE 600 MG/50ML IV SOLN
600.0000 mg | Freq: Four times a day (QID) | INTRAVENOUS | Status: AC
  Administered 2019-05-18 – 2019-05-19 (×2): 600 mg via INTRAVENOUS
  Filled 2019-05-18 (×2): qty 50

## 2019-05-18 MED ORDER — MORPHINE SULFATE (PF) 2 MG/ML IV SOLN: 0.5000 mg | INTRAVENOUS | Status: DC | PRN

## 2019-05-18 MED ORDER — SUGAMMADEX SODIUM 200 MG/2ML IV SOLN
INTRAVENOUS | Status: DC | PRN
  Administered 2019-05-18: 150 mg via INTRAVENOUS

## 2019-05-18 MED ORDER — ROCURONIUM BROMIDE 10 MG/ML (PF) SYRINGE
PREFILLED_SYRINGE | INTRAVENOUS | Status: AC
  Filled 2019-05-18: qty 10

## 2019-05-18 MED ORDER — LEVOTHYROXINE SODIUM 50 MCG PO TABS
50.0000 ug | ORAL_TABLET | Freq: Every day | ORAL | Status: DC
  Administered 2019-05-19 – 2019-05-22 (×4): 50 ug via ORAL
  Filled 2019-05-18 (×4): qty 1

## 2019-05-18 MED ORDER — PROMETHAZINE HCL 25 MG/ML IJ SOLN: 6.2500 mg | INTRAMUSCULAR | Status: DC | PRN

## 2019-05-18 MED ORDER — OXYCODONE HCL 5 MG/5ML PO SOLN: 5.0000 mg | Freq: Once | ORAL | Status: DC | PRN

## 2019-05-18 MED ORDER — CHLORHEXIDINE GLUCONATE 4 % EX LIQD
60.0000 mL | Freq: Once | CUTANEOUS | Status: DC
  Filled 2019-05-18: qty 60

## 2019-05-18 MED ORDER — FENTANYL CITRATE (PF) 100 MCG/2ML IJ SOLN
INTRAMUSCULAR | Status: DC | PRN
  Administered 2019-05-18: 25 ug via INTRAVENOUS
  Administered 2019-05-18: 50 ug via INTRAVENOUS
  Administered 2019-05-18: 100 ug via INTRAVENOUS
  Administered 2019-05-18: 25 ug via INTRAVENOUS

## 2019-05-18 MED ORDER — ROCURONIUM BROMIDE 50 MG/5ML IV SOSY
PREFILLED_SYRINGE | INTRAVENOUS | Status: DC | PRN
  Administered 2019-05-18: 50 mg via INTRAVENOUS

## 2019-05-18 MED ORDER — METOCLOPRAMIDE HCL 5 MG PO TABS: 5.0000 mg | ORAL_TABLET | Freq: Three times a day (TID) | ORAL | Status: DC | PRN

## 2019-05-18 MED ORDER — LIDOCAINE 2% (20 MG/ML) 5 ML SYRINGE
INTRAMUSCULAR | Status: DC | PRN
  Administered 2019-05-18: 80 mg via INTRAVENOUS

## 2019-05-18 MED ORDER — HYDRALAZINE HCL 20 MG/ML IJ SOLN: 10.0000 mg | Freq: Four times a day (QID) | INTRAMUSCULAR | Status: DC | PRN

## 2019-05-18 MED ORDER — ONDANSETRON HCL 4 MG/2ML IJ SOLN
INTRAMUSCULAR | Status: AC
  Filled 2019-05-18: qty 2

## 2019-05-18 MED ORDER — DEXAMETHASONE SODIUM PHOSPHATE 10 MG/ML IJ SOLN
INTRAMUSCULAR | Status: AC
  Filled 2019-05-18: qty 1

## 2019-05-18 MED ORDER — PROPOFOL 1000 MG/100ML IV EMUL
INTRAVENOUS | Status: AC
  Filled 2019-05-18: qty 100

## 2019-05-18 MED ORDER — ACETAMINOPHEN 325 MG PO TABS
325.0000 mg | ORAL_TABLET | Freq: Four times a day (QID) | ORAL | Status: DC | PRN
  Administered 2019-05-20: 650 mg via ORAL
  Filled 2019-05-18: qty 2

## 2019-05-18 MED ORDER — ONDANSETRON HCL 4 MG PO TABS: 4.0000 mg | ORAL_TABLET | Freq: Four times a day (QID) | ORAL | Status: DC | PRN

## 2019-05-18 MED ORDER — 0.9 % SODIUM CHLORIDE (POUR BTL) OPTIME
TOPICAL | Status: DC | PRN
  Administered 2019-05-18: 1000 mL

## 2019-05-18 MED ORDER — METOCLOPRAMIDE HCL 5 MG/ML IJ SOLN
5.0000 mg | Freq: Three times a day (TID) | INTRAMUSCULAR | Status: DC | PRN
  Administered 2019-05-21: 10 mg via INTRAVENOUS
  Filled 2019-05-18: qty 2

## 2019-05-18 MED ORDER — DEXAMETHASONE SODIUM PHOSPHATE 10 MG/ML IJ SOLN
INTRAMUSCULAR | Status: DC | PRN
  Administered 2019-05-18: 8 mg via INTRAVENOUS

## 2019-05-18 MED ORDER — EPHEDRINE SULFATE-NACL 50-0.9 MG/10ML-% IV SOSY
PREFILLED_SYRINGE | INTRAVENOUS | Status: DC | PRN
  Administered 2019-05-18: 5 mg via INTRAVENOUS

## 2019-05-18 MED ORDER — ENSURE PRE-SURGERY PO LIQD
296.0000 mL | Freq: Once | ORAL | Status: AC
  Administered 2019-05-18: 09:00:00 296 mL via ORAL
  Filled 2019-05-18: qty 296

## 2019-05-18 MED ORDER — SODIUM CHLORIDE 0.9 % IV SOLN
INTRAVENOUS | Status: DC
  Administered 2019-05-18: 07:00:00 via INTRAVENOUS

## 2019-05-18 MED ORDER — CLINDAMYCIN PHOSPHATE 900 MG/50ML IV SOLN
900.0000 mg | INTRAVENOUS | Status: AC
  Administered 2019-05-18: 900 mg via INTRAVENOUS
  Filled 2019-05-18: qty 50

## 2019-05-18 MED ORDER — LIDOCAINE 2% (20 MG/ML) 5 ML SYRINGE
INTRAMUSCULAR | Status: AC
  Filled 2019-05-18: qty 5

## 2019-05-18 MED ORDER — ONDANSETRON HCL 4 MG/2ML IJ SOLN: 4.0000 mg | Freq: Four times a day (QID) | INTRAMUSCULAR | Status: DC | PRN

## 2019-05-18 MED ORDER — LACTATED RINGERS IV SOLN
INTRAVENOUS | Status: DC | PRN
  Administered 2019-05-18 (×2): via INTRAVENOUS

## 2019-05-18 MED ORDER — DOCUSATE SODIUM 100 MG PO CAPS
100.0000 mg | ORAL_CAPSULE | Freq: Two times a day (BID) | ORAL | Status: DC
  Administered 2019-05-18 – 2019-05-22 (×7): 100 mg via ORAL
  Filled 2019-05-18 (×8): qty 1

## 2019-05-18 MED ORDER — HYDROCODONE-ACETAMINOPHEN 5-325 MG PO TABS
1.0000 | ORAL_TABLET | Freq: Four times a day (QID) | ORAL | Status: DC | PRN
  Administered 2019-05-18 (×2): 1 via ORAL
  Filled 2019-05-18 (×2): qty 1

## 2019-05-18 MED ORDER — OXYCODONE HCL 5 MG PO TABS: 5.0000 mg | ORAL_TABLET | Freq: Once | ORAL | Status: DC | PRN

## 2019-05-18 MED ORDER — SUCCINYLCHOLINE CHLORIDE 200 MG/10ML IV SOSY
PREFILLED_SYRINGE | INTRAVENOUS | Status: AC
  Filled 2019-05-18: qty 10

## 2019-05-18 MED ORDER — FENTANYL CITRATE (PF) 100 MCG/2ML IJ SOLN
INTRAMUSCULAR | Status: AC
  Administered 2019-05-18: 25 ug via INTRAVENOUS
  Filled 2019-05-18: qty 2

## 2019-05-18 MED ORDER — ONDANSETRON HCL 4 MG/2ML IJ SOLN
4.0000 mg | Freq: Four times a day (QID) | INTRAMUSCULAR | Status: DC | PRN
  Administered 2019-05-21: 4 mg via INTRAVENOUS
  Filled 2019-05-18: qty 2

## 2019-05-18 MED ORDER — MENTHOL 3 MG MT LOZG: 1.0000 | LOZENGE | OROMUCOSAL | Status: DC | PRN

## 2019-05-18 MED ORDER — POLYETHYLENE GLYCOL 3350 17 G PO PACK
17.0000 g | PACK | Freq: Every day | ORAL | Status: DC
  Administered 2019-05-19 – 2019-05-22 (×4): 17 g via ORAL
  Filled 2019-05-18 (×4): qty 1

## 2019-05-18 MED ORDER — ACETAMINOPHEN 10 MG/ML IV SOLN
INTRAVENOUS | Status: AC
  Administered 2019-05-18: 17:00:00 1000 mg via INTRAVENOUS
  Filled 2019-05-18: qty 100

## 2019-05-18 MED ORDER — PROPOFOL 10 MG/ML IV BOLUS
INTRAVENOUS | Status: AC
  Filled 2019-05-18: qty 20

## 2019-05-18 MED ORDER — HYDRALAZINE HCL 20 MG/ML IJ SOLN: 5.0000 mg | INTRAMUSCULAR | Status: DC | PRN

## 2019-05-18 MED ORDER — POTASSIUM CHLORIDE IN NACL 20-0.9 MEQ/L-% IV SOLN
INTRAVENOUS | Status: DC
  Administered 2019-05-18: 21:00:00 via INTRAVENOUS
  Filled 2019-05-18: qty 1000

## 2019-05-18 MED ORDER — SODIUM CHLORIDE 0.9 % IV SOLN
INTRAVENOUS | Status: DC | PRN
  Administered 2019-05-18: 60 ug/min via INTRAVENOUS

## 2019-05-18 SURGICAL SUPPLY — 56 items
BIT DRILL 4.3MMS DISTAL GRDTED (BIT) ×1 IMPLANT
BNDG COHESIVE 6X5 TAN STRL LF (GAUZE/BANDAGES/DRESSINGS) ×3 IMPLANT
BRUSH SCRUB EZ PLAIN DRY (MISCELLANEOUS) ×6 IMPLANT
COVER PERINEAL POST (MISCELLANEOUS) ×3 IMPLANT
COVER SURGICAL LIGHT HANDLE (MISCELLANEOUS) ×6 IMPLANT
COVER WAND RF STERILE (DRAPES) ×3 IMPLANT
DRAPE C-ARMOR (DRAPES) ×3 IMPLANT
DRAPE HALF SHEET 40X57 (DRAPES) IMPLANT
DRAPE ORTHO SPLIT 77X108 STRL (DRAPES) ×4
DRAPE STERI IOBAN 125X83 (DRAPES) ×3 IMPLANT
DRAPE SURG ORHT 6 SPLT 77X108 (DRAPES) ×2 IMPLANT
DRAPE U-SHAPE 47X51 STRL (DRAPES) ×3 IMPLANT
DRILL 4.3MMS DISTAL GRADUATED (BIT) ×3
DRSG EMULSION OIL 3X3 NADH (GAUZE/BANDAGES/DRESSINGS) ×3 IMPLANT
DRSG MEPILEX BORDER 4X4 (GAUZE/BANDAGES/DRESSINGS) ×9 IMPLANT
DRSG MEPILEX BORDER 4X8 (GAUZE/BANDAGES/DRESSINGS) ×3 IMPLANT
ELECT REM PT RETURN 9FT ADLT (ELECTROSURGICAL) ×3
ELECTRODE REM PT RTRN 9FT ADLT (ELECTROSURGICAL) ×1 IMPLANT
GLOVE BIO SURGEON STRL SZ7.5 (GLOVE) ×3 IMPLANT
GLOVE BIO SURGEON STRL SZ8 (GLOVE) ×3 IMPLANT
GLOVE BIOGEL PI IND STRL 7.5 (GLOVE) ×1 IMPLANT
GLOVE BIOGEL PI IND STRL 8 (GLOVE) ×1 IMPLANT
GLOVE BIOGEL PI INDICATOR 7.5 (GLOVE) ×2
GLOVE BIOGEL PI INDICATOR 8 (GLOVE) ×2
GOWN STRL REUS W/ TWL LRG LVL3 (GOWN DISPOSABLE) ×2 IMPLANT
GOWN STRL REUS W/ TWL XL LVL3 (GOWN DISPOSABLE) ×1 IMPLANT
GOWN STRL REUS W/TWL LRG LVL3 (GOWN DISPOSABLE) ×4
GOWN STRL REUS W/TWL XL LVL3 (GOWN DISPOSABLE) ×2
GUIDEPIN 3.2X17.5 THRD DISP (PIN) ×3 IMPLANT
GUIDEWIRE BALL NOSE 100CM (WIRE) ×3 IMPLANT
HIP FRA NAIL LAG SCREW 10.5X90 (Orthopedic Implant) ×3 IMPLANT
HIP FRAC NAIL LEFT 11X360MM (Orthopedic Implant) ×3 IMPLANT
KIT BASIN OR (CUSTOM PROCEDURE TRAY) ×3 IMPLANT
KIT TURNOVER KIT B (KITS) ×3 IMPLANT
MANIFOLD NEPTUNE II (INSTRUMENTS) ×3 IMPLANT
NAIL HIP FRAC LEFT 11X360MM (Orthopedic Implant) ×1 IMPLANT
NS IRRIG 1000ML POUR BTL (IV SOLUTION) ×3 IMPLANT
PACK GENERAL/GYN (CUSTOM PROCEDURE TRAY) ×3 IMPLANT
PAD ARMBOARD 7.5X6 YLW CONV (MISCELLANEOUS) ×6 IMPLANT
SCREW BONE CORTICAL 5.0X36 (Screw) ×3 IMPLANT
SCREW BONE CORTICAL 5.0X40 (Screw) ×3 IMPLANT
SCREW LAG HIP FRA NAIL 10.5X90 (Orthopedic Implant) ×1 IMPLANT
STAPLER VISISTAT 35W (STAPLE) ×3 IMPLANT
STOCKINETTE IMPERVIOUS LG (DRAPES) IMPLANT
SUT ETHILON 2 0 FS 18 (SUTURE) ×6 IMPLANT
SUT ETHILON 3 0 PS 1 (SUTURE) IMPLANT
SUT VIC AB 0 CT1 27 (SUTURE) ×2
SUT VIC AB 0 CT1 27XBRD ANBCTR (SUTURE) ×1 IMPLANT
SUT VIC AB 1 CT1 27 (SUTURE)
SUT VIC AB 1 CT1 27XBRD ANBCTR (SUTURE) IMPLANT
SUT VIC AB 2-0 CT1 27 (SUTURE)
SUT VIC AB 2-0 CT1 TAPERPNT 27 (SUTURE) IMPLANT
SUT VIC AB 2-0 CTB1 (SUTURE) ×3 IMPLANT
TOWEL GREEN STERILE (TOWEL DISPOSABLE) ×6 IMPLANT
TOWEL GREEN STERILE FF (TOWEL DISPOSABLE) ×3 IMPLANT
WATER STERILE IRR 1000ML POUR (IV SOLUTION) ×3 IMPLANT

## 2019-05-18 NOTE — Anesthesia Postprocedure Evaluation (Signed)
Anesthesia Post Note  Patient: Virginia Wright  Procedure(s) Performed: LEFT HIP FRACTURE FIXATION (Left Hip)     Patient location during evaluation: PACU Anesthesia Type: General Level of consciousness: awake and alert Pain management: pain level controlled Vital Signs Assessment: post-procedure vital signs reviewed and stable Respiratory status: spontaneous breathing, nonlabored ventilation, respiratory function stable and patient connected to nasal cannula oxygen Cardiovascular status: blood pressure returned to baseline and stable Postop Assessment: no apparent nausea or vomiting Anesthetic complications: no    Last Vitals:  Vitals:   05/18/19 1245 05/18/19 1552  BP: (!) 150/64   Pulse: 66   Resp: 18   Temp:  (!) 36.2 C  SpO2: 98%     Last Pain:  Vitals:   05/18/19 1552  TempSrc:   PainSc: 0-No pain                 Barnet Glasgow

## 2019-05-18 NOTE — Transfer of Care (Signed)
Immediate Anesthesia Transfer of Care Note  Patient: Virginia Wright  Procedure(s) Performed: LEFT HIP FRACTURE FIXATION (Left Hip)  Patient Location: PACU  Anesthesia Type:General  Level of Consciousness: awake, alert , oriented and patient cooperative  Airway & Oxygen Therapy: Patient Spontanous Breathing  Post-op Assessment: Report given to RN and Post -op Vital signs reviewed and stable  Post vital signs: Reviewed and stable  Last Vitals:  Vitals Value Taken Time  BP 145/55 05/18/19 1552  Temp    Pulse 63 05/18/19 1555  Resp 19 05/18/19 1555  SpO2 100 % 05/18/19 1555  Vitals shown include unvalidated device data.  Last Pain:  Vitals:   05/18/19 1245  TempSrc:   PainSc: 4          Complications: No apparent anesthesia complications

## 2019-05-18 NOTE — ED Notes (Signed)
Son given all belongings and decided to take them to vehicle.

## 2019-05-18 NOTE — Consult Note (Signed)
Reason for Consult:Left hip fx Referring Physician: [name] is an 85 y.o. female.  HPI: Virginia Wright was at home doing some gardening yesterday. She was pulling on a honeysuckle vine when it broke and she fell backwards. She had immediate left hip pain and could not get up. She had to lay there until a neighbor noticed her and could summon help. She was brought to the ED where x-rays showed a left hip fx and orthopedic surgery was consulted. Due to scheduling issues Dr. August Saucer requested Dr. Carola Frost take over care to provide timely fixation of this fracture. She c/o localized pain to the left hip. She lives alone but doesn't usually ambulate with any assistive devices.  Past Medical History:  Diagnosis Date  . Arthritis, rheumatoid (HCC)   . High blood pressure   . Postural tremor   . Rheumatoid arthritis (HCC)   . Unspecified hypothyroidism     Past Surgical History:  Procedure Laterality Date  . CATARACT EXTRACTION Bilateral   . DILATION AND CURETTAGE OF UTERUS    . kidney stones      No family history on file.  Social History:  reports that she has never smoked. She has never used smokeless tobacco. She reports that she does not drink alcohol or use drugs.  Allergies:  Allergies  Allergen Reactions  . Celecoxib Other (See Comments)    Other reaction(s): SWELLING/EDEMA  . Clarithromycin Other (See Comments)    Patient doesn't remember reaction  . Cefdinir Rash  . Codeine Nausea Only  . Gatifloxacin Other (See Comments)    Patient doesn't remember reaction  . Hydroxychloroquine Other (See Comments)    Patient doesn't remember reaction    Medications: I have reviewed the patient's current medications.  Results for orders placed or performed during the hospital encounter of 05/17/19 (from the past 48 hour(s))  Comprehensive metabolic panel     Status: Abnormal   Collection Time: 05/17/19  9:01 PM  Result Value Ref Range   Sodium 138 135 - 145 mmol/L   Potassium 5.2  (H) 3.5 - 5.1 mmol/L   Chloride 106 98 - 111 mmol/L   CO2 22 22 - 32 mmol/L   Glucose, Bld 158 (H) 70 - 99 mg/dL   BUN 27 (H) 8 - 23 mg/dL   Creatinine, Ser 5.10 (H) 0.44 - 1.00 mg/dL   Calcium 9.0 8.9 - 25.8 mg/dL   Total Protein 6.4 (L) 6.5 - 8.1 g/dL   Albumin 4.1 3.5 - 5.0 g/dL   AST 19 15 - 41 U/L   ALT 19 0 - 44 U/L   Alkaline Phosphatase 49 38 - 126 U/L   Total Bilirubin 1.4 (H) 0.3 - 1.2 mg/dL   GFR calc non Af Amer 40 (L) >60 mL/min   GFR calc Af Amer 47 (L) >60 mL/min   Anion gap 10 5 - 15    Comment: Performed at [facility], [address] [place], [place] [address]  CBC with Differential     Status: Abnormal   Collection Time: 05/17/19  9:01 PM  Result Value Ref Range   WBC 6.3 4.0 - 10.5 K/uL   RBC 3.48 (L) 3.87 - 5.11 MIL/uL   Hemoglobin 13.1 12.0 - 15.0 g/dL   HCT 24.2 35.3 - 61.4 %   MCV 106.9 (H) 80.0 - 100.0 fL   MCH 37.6 (H) 26.0 - 34.0 pg   MCHC 35.2 30.0 - 36.0 g/dL   RDW 43.1 54.0 -  15.5 %   Platelets 104 (L) 150 - 400 K/uL    Comment: REPEATED TO VERIFY PLATELET COUNT CONFIRMED BY SMEAR Immature Platelet Fraction may be clinically indicated, consider ordering this additional test [id number]    nRBC 0.0 0.0 - 0.2 %   Neutrophils Relative % 77 %   Neutro Abs 4.9 1.7 - 7.7 K/uL   Lymphocytes Relative 10 %   Lymphs Abs 0.7 0.7 - 4.0 K/uL   Monocytes Relative 12 %   Monocytes Absolute 0.7 0.1 - 1.0 K/uL   Eosinophils Relative 0 %   Eosinophils Absolute 0.0 0.0 - 0.5 K/uL   Basophils Relative 0 %   Basophils Absolute 0.0 0.0 - 0.1 K/uL   Immature Granulocytes 1 %   Abs Immature Granulocytes 0.03 0.00 - 0.07 K/uL    Comment: Performed at [facility], [address], [place], [place] [address]  Protime-INR     Status: None   Collection Time: 05/17/19  9:01 PM  Result Value Ref Range   Prothrombin Time 13.5 11.4 - 15.2 seconds   INR 1.0 0.8 - 1.2    Comment: (NOTE) INR goal varies based on device and disease states. Performed at  [facility], [address], [place], [place] [address]   Basic metabolic panel     Status: Abnormal   Collection Time: 05/18/19  4:03 AM  Result Value Ref Range   Sodium 139 135 - 145 mmol/L   Potassium 4.8 3.5 - 5.1 mmol/L   Chloride 106 98 - 111 mmol/L   CO2 26 22 - 32 mmol/L   Glucose, Bld 156 (H) 70 - 99 mg/dL   BUN 25 (H) 8 - 23 mg/dL   Creatinine, Ser 3.35 (H) 0.44 - 1.00 mg/dL   Calcium 8.5 (L) 8.9 - 10.3 mg/dL   GFR calc non Af Amer 45 (L) >60 mL/min   GFR calc Af Amer 52 (L) >60 mL/min   Anion gap 7 5 - 15    Comment: Performed at [facility], [address], [place], [place] [address]  Bilirubin, fractionated(tot/dir/indir)     Status: None   Collection Time: 05/18/19  4:03 AM  Result Value Ref Range   Total Bilirubin 0.9 0.3 - 1.2 mg/dL   Bilirubin, Direct 0.2 0.0 - 0.2 mg/dL   Indirect Bilirubin 0.7 0.3 - 0.9 mg/dL    Comment: Performed at [facility], [address] [place], [place] [address]    Dg Chest 1 View  Result Date: 05/17/2019 CLINICAL DATA:  Fall with left hip fracture. EXAM: CHEST  1 VIEW COMPARISON:  Report from radiograph 08/31/2018, images not available. FINDINGS: The cardiomediastinal contours are unchanged. Stable borderline cardiomegaly. Pulmonary vasculature is normal. No consolidation, pleural effusion, or pneumothorax. No acute osseous abnormalities are seen. IMPRESSION: 1. No acute abnormality. 2. Borderline cardiomegaly. Electronically Signed   By: Narda Rutherford M.D.   On: 05/17/2019 20:47   Dg Knee 1-2 Views Left  Result Date: 05/18/2019 CLINICAL DATA:  Left leg pain EXAM: LEFT KNEE - 1-2 VIEW COMPARISON:  None. FINDINGS: No evidence of fracture, dislocation, or joint effusion. No evidence of arthropathy or other focal bone abnormality. Soft tissues are unremarkable. IMPRESSION: Negative. Electronically Signed   By: Duanne Guess M.D.   On: 05/18/2019 09:16   Dg Hip Unilat W Or Wo Pelvis 2-3 Views Left  Result Date:  05/17/2019 CLINICAL DATA:  Left hip pain after mechanical fall today. EXAM: DG HIP (WITH  OR WITHOUT PELVIS) 2-3V LEFT COMPARISON:  None. FINDINGS: Comminuted and displaced left proximal femur fracture involving the lesser trochanter and subtrochanteric femur. There is apex lateral angulation and mild proximal migration of the femoral shaft. Femoral head remains seated. Pubic rami are intact. Degenerative change of the pubic symphysis and sacroiliac joints. IMPRESSION: Comminuted and displaced left proximal femur fracture involving the lesser trochanter and subtrochanteric femur. Electronically Signed   By: Keith Rake M.D.   On: 05/17/2019 20:45    Review of Systems  Constitutional: Negative for weight loss.  HENT: Negative for ear discharge, ear pain, hearing loss and tinnitus.   Eyes: Negative for blurred vision, double vision, photophobia and pain.  Respiratory: Negative for cough, sputum production and shortness of breath.   Cardiovascular: Negative for chest pain.  Gastrointestinal: Negative for abdominal pain, nausea and vomiting.  Genitourinary: Negative for dysuria, flank pain, frequency and urgency.  Musculoskeletal: Positive for joint pain (Left hip). Negative for back pain, falls, myalgias and neck pain.  Neurological: Negative for dizziness, tingling, sensory change, focal weakness, loss of consciousness and headaches.  Endo/Heme/Allergies: Does not bruise/bleed easily.  Psychiatric/Behavioral: Negative for depression, memory loss and substance abuse. The patient is not nervous/anxious.    Blood pressure 122/60, pulse 91, temperature (!) 97.5 F (36.4 C), temperature source Oral, resp. rate 16, SpO2 96 %. Physical Exam  Constitutional: She appears well-developed and well-nourished. No distress.  HENT:  Head: Normocephalic and atraumatic.  Eyes: Conjunctivae are normal. Right eye exhibits no discharge. Left eye exhibits no discharge. No scleral icterus.  Neck: Normal range of  motion.  Cardiovascular: Normal rate and regular rhythm.  Respiratory: Effort normal. No respiratory distress.  Musculoskeletal:     Comments: LLE No traumatic wounds, ecchymosis, or rash  Mod TTP hip  No knee or ankle effusion  Knee stable to varus/ valgus and anterior/posterior stress  Sens DPN, SPN, TN intact  Motor EHL, ext, flex, evers 5/5  DP 2+, PT 1+, No significant edema  Neurological: She is alert.  Skin: Skin is warm and dry. She is not diaphoretic.  Psychiatric: She has a normal mood and affect. Her behavior is normal.    Assessment/Plan: Left hip fx -- Plan on IMN with Dr. Marcelino Scot today. NPO until then. Multiple medical problems including rheumatoid arthritis, chronic thrombocytopenia, hypertension, and hypothyroidism -- per primary service    Lisette Abu, PA-C Orthopedic Surgery [phone] 05/18/2019, 10:29 AM

## 2019-05-18 NOTE — ED Notes (Signed)
Son in the family waiting area.

## 2019-05-18 NOTE — Progress Notes (Signed)
Patient back on unit from xray.

## 2019-05-18 NOTE — ED Notes (Signed)
Patient transported to X-ray 

## 2019-05-18 NOTE — Progress Notes (Signed)
PROGRESS NOTE    Virginia Wright  ZDG:[id number] DOB: [birth year] DOA: 05/17/2019 PCP: Chilton Greathouse, NP   Brief Narrative:  Virginia Wright is a 85 y.o. female with medical history significant of rheumatoid arthritis, chronic thrombocytopenia, hypertension, hypothyroidism presented to the hospital via EMS for evaluation of left hip and thigh pain after a fall.  Patient states that she was in her usual state of health and fell while doing gardening in her yard.upon arrival to ER, her vital signs were stable.  No leukocytosis.  X-ray of left hip showed comminuted and displaced left proximal femur fracture involving the lesser trochanter and subtrochanteric femur and she was subsequently admitted to hospital service and orthopedics consulted.  Assessment & Plan:   Principal Problem:   Hip fracture (HCC) Active Problems:   Thrombocytopenia (HCC)   Elevated serum creatinine   Serum total bilirubin elevated   Essential hypertension   Left proximal femur fracture: to OR later today with orthopedics.  Management per orthopedics.  Essential hypertension: Controlled.  Continue propranolol.  Amlodipine on hold.  Will place on PRN hydralazine  Chronic thrombocytopenia: Stable.  No signs of bleeding.  Hyperkalemia: Resolved.  Mild AKI: Creatinine is stable.  Continue gentle hydration.  Repeat labs in the morning.  Hypothyroidism: Continue Synthroid  DVT prophylaxis: SCDs for now.  Will place on Lovenox once cleared by orthopedics. Code Status: Full code Family Communication:  Plan of care discussed with patient and her son at length who was at bedside.  They verbalized understanding and agreed with it. Disposition Plan: TBD  Consultants:   Orthopedics  Procedures:   None  Antimicrobials:   None   Subjective: Patient seen and examined.  Son at the bedside.  Her left hip pain is controlled as long as she does not move it.  No other complaint.  Objective: Vitals:   05/18/19  0400 05/18/19 0500 05/18/19 0600 05/18/19 0700  BP: 133/65 (!) 141/65 (!) 146/64 (!) 139/59  Pulse:      Resp: 15 18 19 15   Temp:      TempSrc:      SpO2:       No intake or output data in the 24 hours ending 05/18/19 0831 There were no vitals filed for this visit.  Examination:  General exam: Appears calm and comfortable  Respiratory system: Clear to auscultation. Respiratory effort normal. Cardiovascular system: S1 & S2 heard, RRR. No JVD, murmurs, rubs, gallops or clicks. No pedal edema. Gastrointestinal system: Abdomen is nondistended, soft and nontender. No organomegaly or masses felt. Normal bowel sounds heard. Central nervous system: Alert and oriented. No focal neurological deficits. Extremities: Left lower extremity shortened and externally rotated Skin: No rashes, lesions or ulcers Psychiatry: Judgement and insight appear normal. Mood & affect appropriate.    Data Reviewed: I have personally reviewed following labs and imaging studies  CBC: Recent Labs  Lab 05/17/19 2101  WBC 6.3  NEUTROABS 4.9  HGB 13.1  HCT 37.2  MCV 106.9*  PLT 104*   Basic Metabolic Panel: Recent Labs  Lab 05/17/19 2101 05/18/19 0403  NA 138 139  K 5.2* 4.8  CL 106 106  CO2 22 26  GLUCOSE 158* 156*  BUN 27* 25*  CREATININE 1.23* 1.12*  CALCIUM 9.0 8.5*   GFR: CrCl cannot be calculated (Unknown ideal weight.). Liver Function Tests: Recent Labs  Lab 05/17/19 2101 05/18/19 0403  AST 19  --   ALT 19  --   ALKPHOS 49  --   BILITOT  1.4* 0.9  PROT 6.4*  --   ALBUMIN 4.1  --    No results for input(s): LIPASE, AMYLASE in the last 168 hours. No results for input(s): AMMONIA in the last 168 hours. Coagulation Profile: Recent Labs  Lab 05/17/19 2101  INR 1.0   Cardiac Enzymes: No results for input(s): CKTOTAL, CKMB, CKMBINDEX, TROPONINI in the last 168 hours. BNP (last 3 results) No results for input(s): PROBNP in the last 8760 hours. HbA1C: No results for input(s):  HGBA1C in the last 72 hours. CBG: No results for input(s): GLUCAP in the last 168 hours. Lipid Profile: No results for input(s): CHOL, HDL, LDLCALC, TRIG, CHOLHDL, LDLDIRECT in the last 72 hours. Thyroid Function Tests: No results for input(s): TSH, T4TOTAL, FREET4, T3FREE, THYROIDAB in the last 72 hours. Anemia Panel: No results for input(s): VITAMINB12, FOLATE, FERRITIN, TIBC, IRON, RETICCTPCT in the last 72 hours. Sepsis Labs: No results for input(s): PROCALCITON, LATICACIDVEN in the last 168 hours.  No results found for this or any previous visit (from the past 240 hour(s)).    Radiology Studies: Dg Chest 1 View  Result Date: 05/17/2019 CLINICAL DATA:  Fall with left hip fracture. EXAM: CHEST  1 VIEW COMPARISON:  Report from radiograph 08/31/2018, images not available. FINDINGS: The cardiomediastinal contours are unchanged. Stable borderline cardiomegaly. Pulmonary vasculature is normal. No consolidation, pleural effusion, or pneumothorax. No acute osseous abnormalities are seen. IMPRESSION: 1. No acute abnormality. 2. Borderline cardiomegaly. Electronically Signed   By: Keith Rake M.D.   On: 05/17/2019 20:47   Dg Hip Unilat W Or Wo Pelvis 2-3 Views Left  Result Date: 05/17/2019 CLINICAL DATA:  Left hip pain after mechanical fall today. EXAM: DG HIP (WITH OR WITHOUT PELVIS) 2-3V LEFT COMPARISON:  None. FINDINGS: Comminuted and displaced left proximal femur fracture involving the lesser trochanter and subtrochanteric femur. There is apex lateral angulation and mild proximal migration of the femoral shaft. Femoral head remains seated. Pubic rami are intact. Degenerative change of the pubic symphysis and sacroiliac joints. IMPRESSION: Comminuted and displaced left proximal femur fracture involving the lesser trochanter and subtrochanteric femur. Electronically Signed   By: Keith Rake M.D.   On: 05/17/2019 20:45    Scheduled Meds: . chlorhexidine  60 mL Topical Once  . feeding  supplement  296 mL Oral Once  . folic acid  1 mg Oral Daily  . levothyroxine  50 mcg Oral Q0600  . povidone-iodine  2 application Topical Once  . propranolol  40 mg Oral BID   Continuous Infusions: . sodium chloride 75 mL/hr at 05/18/19 0653     LOS: 1 day   Time spent: 33 minutes   Darliss Cheney, MD [facility] Pager [phone]  If 7PM-7AM, please contact night-coverage [email or web address] Password [facility] 05/18/2019, 8:31 AM

## 2019-05-18 NOTE — Anesthesia Preprocedure Evaluation (Addendum)
Anesthesia Evaluation  Patient identified by MRN, date of birth, ID band Patient awake    Reviewed: Allergy & Precautions, NPO status , Patient's Chart, lab work & pertinent test results, reviewed documented beta blocker date and time   History of Anesthesia Complications Negative for: history of anesthetic complications  Airway Mallampati: III  TM Distance: >3 FB Neck ROM: Full    Dental no notable dental hx.    Pulmonary asthma ,    Pulmonary exam normal        Cardiovascular hypertension, Pt. on medications and Pt. on home beta blockers Normal cardiovascular exam     Neuro/Psych negative neurological ROS  negative psych ROS   GI/Hepatic negative GI ROS, Neg liver ROS,   Endo/Other  Hypothyroidism   Renal/GU negative Renal ROS  negative genitourinary   Musculoskeletal  (+) Arthritis ,   Abdominal   Peds  Hematology negative hematology ROS (+)   Anesthesia Other Findings Day of surgery medications reviewed with patient.  Reproductive/Obstetrics negative OB ROS                            Anesthesia Physical Anesthesia Plan  ASA: II  Anesthesia Plan: General   Post-op Pain Management:    Induction: Intravenous  PONV Risk Score and Plan: 4 or greater and Treatment may vary due to age or medical condition, Ondansetron, Dexamethasone and Midazolam  Airway Management Planned: Oral ETT  Additional Equipment: None  Intra-op Plan:   Post-operative Plan: Extubation in OR  Informed Consent: I have reviewed the patients History and Physical, chart, labs and discussed the procedure including the risks, benefits and alternatives for the proposed anesthesia with the patient or authorized representative who has indicated his/her understanding and acceptance.     Dental advisory given  Plan Discussed with: CRNA  Anesthesia Plan Comments:        Anesthesia Quick Evaluation

## 2019-05-18 NOTE — Anesthesia Procedure Notes (Signed)
Procedure Name: Intubation Date/Time: 05/18/2019 1:37 PM Performed by: Orlie Dakin, CRNA Pre-anesthesia Checklist: Patient identified, Emergency Drugs available, Suction available and Patient being monitored Patient Re-evaluated:Patient Re-evaluated prior to induction Oxygen Delivery Method: Circle system utilized Preoxygenation: Pre-oxygenation with 100% oxygen Induction Type: IV induction Ventilation: Mask ventilation without difficulty Laryngoscope Size: Miller and 3 Grade View: Grade I Tube type: Oral Tube size: 7.0 mm Number of attempts: 1 Airway Equipment and Method: Stylet Placement Confirmation: ETT inserted through vocal cords under direct vision,  positive ETCO2 and breath sounds checked- equal and bilateral Secured at: 22 cm Tube secured with: Tape Dental Injury: Teeth and Oropharynx as per pre-operative assessment  Comments: 4x4s bite block used.

## 2019-05-18 NOTE — Progress Notes (Signed)
Patient to xray.

## 2019-05-19 ENCOUNTER — Encounter (HOSPITAL_COMMUNITY): Payer: Self-pay | Admitting: Orthopedic Surgery

## 2019-05-19 LAB — BASIC METABOLIC PANEL
Anion gap: 9 (ref 5–15)
BUN: 26 mg/dL — ABNORMAL HIGH (ref 8–23)
CO2: 24 mmol/L (ref 22–32)
Calcium: 8.1 mg/dL — ABNORMAL LOW (ref 8.9–10.3)
Chloride: 104 mmol/L (ref 98–111)
Creatinine, Ser: 1.57 mg/dL — ABNORMAL HIGH (ref 0.44–1.00)
GFR calc Af Amer: 35 mL/min — ABNORMAL LOW (ref >60)
GFR calc non Af Amer: 30 mL/min — ABNORMAL LOW (ref >60)
Glucose, Bld: 210 mg/dL — ABNORMAL HIGH (ref 70–99)
Potassium: 5.2 mmol/L — ABNORMAL HIGH (ref 3.5–5.1)
Sodium: 137 mmol/L (ref 135–145)

## 2019-05-19 LAB — CBC
HCT: 24.8 % — ABNORMAL LOW (ref 36.0–46.0)
Hemoglobin: 8.3 g/dL — ABNORMAL LOW (ref 12.0–15.0)
MCH: 36.9 pg — ABNORMAL HIGH (ref 26.0–34.0)
MCHC: 33.5 g/dL (ref 30.0–36.0)
MCV: 110.2 fL — ABNORMAL HIGH (ref 80.0–100.0)
Platelets: 85 10*3/uL — ABNORMAL LOW (ref 150–400)
RBC: 2.25 MIL/uL — ABNORMAL LOW (ref 3.87–5.11)
RDW: 14.9 % (ref 11.5–15.5)
WBC: 8.3 10*3/uL (ref 4.0–10.5)
nRBC: 0 % (ref 0.0–0.2)

## 2019-05-19 LAB — CBC WITH DIFFERENTIAL/PLATELET
Abs Immature Granulocytes: 0.06 10*3/uL (ref 0.00–0.07)
Basophils Absolute: 0 10*3/uL (ref 0.0–0.1)
Basophils Relative: 0 %
Eosinophils Absolute: 0 10*3/uL (ref 0.0–0.5)
Eosinophils Relative: 0 %
HCT: 20.8 % — ABNORMAL LOW (ref 36.0–46.0)
Hemoglobin: 7.3 g/dL — ABNORMAL LOW (ref 12.0–15.0)
Immature Granulocytes: 1 %
Lymphocytes Relative: 8 %
Lymphs Abs: 0.8 10*3/uL (ref 0.7–4.0)
MCH: 38.6 pg — ABNORMAL HIGH (ref 26.0–34.0)
MCHC: 35.1 g/dL (ref 30.0–36.0)
MCV: 110.1 fL — ABNORMAL HIGH (ref 80.0–100.0)
Monocytes Absolute: 0.9 10*3/uL (ref 0.1–1.0)
Monocytes Relative: 10 %
Neutro Abs: 7.5 10*3/uL (ref 1.7–7.7)
Neutrophils Relative %: 81 %
Platelets: 92 10*3/uL — ABNORMAL LOW (ref 150–400)
RBC: 1.89 MIL/uL — ABNORMAL LOW (ref 3.87–5.11)
RDW: 15.2 % (ref 11.5–15.5)
WBC: 9.3 10*3/uL (ref 4.0–10.5)
nRBC: 0.5 % — ABNORMAL HIGH (ref 0.0–0.2)

## 2019-05-19 MED ORDER — VITAMIN C 500 MG PO TABS
500.0000 mg | ORAL_TABLET | Freq: Every day | ORAL | Status: DC
  Administered 2019-05-19 – 2019-05-22 (×4): 500 mg via ORAL
  Filled 2019-05-19 (×4): qty 1

## 2019-05-19 MED ORDER — SODIUM CHLORIDE 0.9 % IV SOLN
INTRAVENOUS | Status: DC
  Administered 2019-05-19: 11:00:00 via INTRAVENOUS

## 2019-05-19 MED ORDER — ENSURE ENLIVE PO LIQD
237.0000 mL | Freq: Every day | ORAL | Status: DC
  Administered 2019-05-19 – 2019-05-20 (×2): 237 mL via ORAL

## 2019-05-19 MED ORDER — VITAMIN D 25 MCG (1000 UNIT) PO TABS
2000.0000 [IU] | ORAL_TABLET | Freq: Two times a day (BID) | ORAL | Status: DC
  Administered 2019-05-19 – 2019-05-22 (×7): 2000 [IU] via ORAL
  Filled 2019-05-19 (×7): qty 2

## 2019-05-19 MED ORDER — SODIUM CHLORIDE 0.9 % IV BOLUS
500.0000 mL | Freq: Once | INTRAVENOUS | Status: AC
  Administered 2019-05-19: 09:00:00 500 mL via INTRAVENOUS

## 2019-05-19 NOTE — Progress Notes (Addendum)
PROGRESS NOTE    Virginia Wright  ZOX:[id number] DOB: 12-10-1932 DOA: 05/17/2019 PCP: Chilton GreathouseSmith, Donna C, NP   Brief Narrative:  Virginia Wright is a 85 y.o. female with medical history significant of rheumatoid arthritis, chronic thrombocytopenia, hypertension, hypothyroidism presented to the hospital via EMS for evaluation of left hip and thigh pain after a fall.  Patient states that she was in her usual state of health and fell while doing gardening in her yard.upon arrival to ER, her vital signs were stable.  No leukocytosis.  X-ray of left hip showed comminuted and displaced left proximal femur fracture involving the lesser trochanter and subtrochanteric femur and she was subsequently admitted to hospital service and orthopedics consulted and she underwent IMN on 05/18/2019.  Assessment & Plan:   Principal Problem:   Hip fracture (HCC) Active Problems:   Thrombocytopenia (HCC)   Elevated serum creatinine   Serum total bilirubin elevated   Essential hypertension   Left proximal femur fracture: Postop day 1 IMN.  Pain controlled.  Management per orthopedics.  Essential hypertension: Controlled.  Continue propranolol and PRN hydralazine.  Amlodipine on hold.   Acute blood loss anemia: Hemoglobin dropped from 13.1 preop to 8.3 postop.  No indication of transfusion.  Repeat CBC now.  Transfuse if hemoglobin drops less than 7.  Hyperkalemia: Mild. 5.2. Continue IV fluids. Hopefully this will resolve.  Chronic thrombocytopenia: Stable.  No signs of bleeding.  Hyperkalemia: Resolved.  Mild AKI: Creatinine is stable.  Continue gentle hydration.  Repeat labs in the morning.  Hypothyroidism: Continue Synthroid  DVT prophylaxis: SCDs for now.  Holding Lovenox for thrombocytopenia as recommended by orthopedics. Code Status: Full code Family Communication:  Plan of care discussed with patient.  Disposition Plan: PT assessment pending.  OT recommends SNF.  Case manager on board.   Consultants:   Orthopedics  Procedures:   IMN on 05/18/2019  Antimicrobials:   None   Subjective: Patient seen and examined patient said that she is doing well.  Pain is very well controlled.  No new complaint.  Objective: Vitals:   05/18/19 1900 05/18/19 1930 05/18/19 2235 05/19/19 0432  BP: 98/83 (!) 116/52 134/62 (!) 133/55  Pulse: 71 75 79 70  Resp: 17 18  20   Temp: 98 F (36.7 C) 98.4 F (36.9 C)  97.8 F (36.6 C)  TempSrc:  Oral  Oral  SpO2: 97% 99%  99%  Weight:  67.1 kg    Height:  5\' 2"  (1.575 m)      Intake/Output Summary (Last 24 hours) at 05/19/2019 0758 Last data filed at 05/19/2019 0600 Gross per 24 hour  Intake 1744.14 ml  Output 350 ml  Net 1394.14 ml   Filed Weights   05/18/19 1355 05/18/19 1930  Weight: 68 kg 67.1 kg    Examination:  General exam: Appears calm and comfortable  Respiratory system: Clear to auscultation. Respiratory effort normal. Cardiovascular system: S1 & S2 heard, RRR. No JVD, murmurs, rubs, gallops or clicks. No pedal edema. Gastrointestinal system: Abdomen is nondistended, soft and nontender. No organomegaly or masses felt. Normal bowel sounds heard. Central nervous system: Alert and oriented. No focal neurological deficits. Skin: No rashes, lesions or ulcers.  Psychiatry: Judgement and insight appear normal. Mood & affect appropriate.    Data Reviewed: I have personally reviewed following labs and imaging studies  CBC: Recent Labs  Lab 05/17/19 2101 05/18/19 2029 05/19/19 0103  WBC 6.3 8.8 8.3  NEUTROABS 4.9  --   --   HGB 13.1 9.3*  8.3*  HCT 37.2 27.3* 24.8*  MCV 106.9* 108.8* 110.2*  PLT 104* 38* 85*   Basic Metabolic Panel: Recent Labs  Lab 05/17/19 2101 05/18/19 0403 05/19/19 0103  NA 138 139 137  K 5.2* 4.8 5.2*  CL 106 106 104  CO2 22 26 24   GLUCOSE 158* 156* 210*  BUN 27* 25* 26*  CREATININE 1.23* 1.12* 1.57*  CALCIUM 9.0 8.5* 8.1*   GFR: Estimated Creatinine Clearance: 24 mL/min (A) (by  C-G formula based on SCr of 1.57 mg/dL (H)). Liver Function Tests: Recent Labs  Lab 05/17/19 2101 05/18/19 0403  AST 19  --   ALT 19  --   ALKPHOS 49  --   BILITOT 1.4* 0.9  PROT 6.4*  --   ALBUMIN 4.1  --    No results for input(s): LIPASE, AMYLASE in the last 168 hours. No results for input(s): AMMONIA in the last 168 hours. Coagulation Profile: Recent Labs  Lab 05/17/19 2101  INR 1.0   Cardiac Enzymes: No results for input(s): CKTOTAL, CKMB, CKMBINDEX, TROPONINI in the last 168 hours. BNP (last 3 results) No results for input(s): PROBNP in the last 8760 hours. HbA1C: No results for input(s): HGBA1C in the last 72 hours. CBG: No results for input(s): GLUCAP in the last 168 hours. Lipid Profile: No results for input(s): CHOL, HDL, LDLCALC, TRIG, CHOLHDL, LDLDIRECT in the last 72 hours. Thyroid Function Tests: No results for input(s): TSH, T4TOTAL, FREET4, T3FREE, THYROIDAB in the last 72 hours. Anemia Panel: No results for input(s): VITAMINB12, FOLATE, FERRITIN, TIBC, IRON, RETICCTPCT in the last 72 hours. Sepsis Labs: No results for input(s): PROCALCITON, LATICACIDVEN in the last 168 hours.  Recent Results (from the past 240 hour(s))  SARS CORONAVIRUS 2 (TAT 6-24 HRS) Nasopharyngeal Nasopharyngeal Swab     Status: None   Collection Time: 05/17/19 10:10 PM   Specimen: Nasopharyngeal Swab  Result Value Ref Range Status   SARS Coronavirus 2 NEGATIVE NEGATIVE Final    Comment: (NOTE) SARS-CoV-2 target nucleic acids are NOT DETECTED. The SARS-CoV-2 RNA is generally detectable in upper and lower respiratory specimens during the acute phase of infection. Negative results do not preclude SARS-CoV-2 infection, do not rule out co-infections with other pathogens, and should not be used as the sole basis for treatment or other patient management decisions. Negative results must be combined with clinical observations, patient history, and epidemiological information. The  expected result is Negative. Fact Sheet for Patients: SugarRoll.be Fact Sheet for Healthcare Providers: https://www.woods-mathews.com/ This test is not yet approved or cleared by the [place] FDA and  has been authorized for detection and/or diagnosis of SARS-CoV-2 by FDA under an Emergency Use Authorization (EUA). This EUA will remain  in effect (meaning this test can be used) for the duration of the COVID-19 declaration under Section 56 4(b)(1) of the Act, 21 U.S.C. section 360bbb-3(b)(1), unless the authorization is terminated or revoked sooner. Performed at [facility], [place] [address], [place], [place] [address]   SARS Coronavirus 2 [facility] order, Performed in [facility] hospital lab) Nasopharyngeal Nasopharyngeal Swab     Status: None   Collection Time: 05/18/19 10:00 AM   Specimen: Nasopharyngeal Swab  Result Value Ref Range Status   SARS Coronavirus 2 NEGATIVE NEGATIVE Final    Comment: (NOTE) If result is NEGATIVE SARS-CoV-2 target nucleic acids are NOT DETECTED. The SARS-CoV-2 RNA is generally detectable in upper and lower  respiratory specimens during the acute phase of infection. The lowest  concentration of  SARS-CoV-2 viral copies this assay can detect is 250  copies / mL. A negative result does not preclude SARS-CoV-2 infection  and should not be used as the sole basis for treatment or other  patient management decisions.  A negative result may occur with  improper specimen collection / handling, submission of specimen other  than nasopharyngeal swab, presence of viral mutation(s) within the  areas targeted by this assay, and inadequate number of viral copies  (<250 copies / mL). A negative result must be combined with clinical  observations, patient history, and epidemiological information. If result is POSITIVE SARS-CoV-2 target nucleic acids are DETECTED. The SARS-CoV-2 RNA is generally detectable in upper  and lower  respiratory specimens dur ing the acute phase of infection.  Positive  results are indicative of active infection with SARS-CoV-2.  Clinical  correlation with patient history and other diagnostic information is  necessary to determine patient infection status.  Positive results do  not rule out bacterial infection or co-infection with other viruses. If result is PRESUMPTIVE POSTIVE SARS-CoV-2 nucleic acids MAY BE PRESENT.   A presumptive positive result was obtained on the submitted specimen  and confirmed on repeat testing.  While 2019 novel coronavirus  (SARS-CoV-2) nucleic acids may be present in the submitted sample  additional confirmatory testing may be necessary for epidemiological  and / or clinical management purposes  to differentiate between  SARS-CoV-2 and other Sarbecovirus currently known to infect humans.  If clinically indicated additional testing with an alternate test  methodology [phone]([id number]) is advised. The SARS-CoV-2 RNA is generally  detectable in upper and lower respiratory sp ecimens during the acute  phase of infection. The expected result is Negative. Fact Sheet for Patients:  BoilerBrush.com.cyhttps://www.fda.gov/media/136312/download Fact Sheet for Healthcare Providers: https://pope.com/https://www.fda.gov/media/136313/download This test is not yet approved or cleared by the [place] FDA and has been authorized for detection and/or diagnosis of SARS-CoV-2 by FDA under an Emergency Use Authorization (EUA).  This EUA will remain in effect (meaning this test can be used) for the duration of the COVID-19 declaration under Section 564(b)(1) of the Act, 21 U.S.C. section 360bbb-3(b)(1), unless the authorization is terminated or revoked sooner. Performed at [facility], [address] [place], [place] [address]       Radiology Studies: Dg Chest 1 View  Result Date: 05/17/2019 CLINICAL DATA:  Fall with left hip fracture. EXAM: CHEST  1 VIEW COMPARISON:  Report from  radiograph 08/31/2018, images not available. FINDINGS: The cardiomediastinal contours are unchanged. Stable borderline cardiomegaly. Pulmonary vasculature is normal. No consolidation, pleural effusion, or pneumothorax. No acute osseous abnormalities are seen. IMPRESSION: 1. No acute abnormality. 2. Borderline cardiomegaly. Electronically Signed   By: Narda RutherfordMelanie  Sanford M.D.   On: 05/17/2019 20:47   Dg Knee 1-2 Views Left  Result Date: 05/18/2019 CLINICAL DATA:  Left leg pain EXAM: LEFT KNEE - 1-2 VIEW COMPARISON:  None. FINDINGS: No evidence of fracture, dislocation, or joint effusion. No evidence of arthropathy or other focal bone abnormality. Soft tissues are unremarkable. IMPRESSION: Negative. Electronically Signed   By: Duanne GuessNicholas  Plundo M.D.   On: 05/18/2019 09:16   Pelvis Portable  Result Date: 05/18/2019 CLINICAL DATA:  Left hip fracture, fixation EXAM: PORTABLE PELVIS 1-2 VIEWS COMPARISON:  05/17/2019 FINDINGS: Internal fixation across the proximal left femoral shaft fracture. Continued mild displacement of fracture fragments. No hardware complicating feature. IMPRESSION: Internal fixation as above. Electronically Signed   By: Charlett NoseKevin  Dover M.D.   On: 05/18/2019 20:10   Dg C-arm  1-60 Min  Result Date: 05/18/2019 CLINICAL DATA:  Open reduction and internal fixation of left femoral fracture. EXAM: DG C-ARM 1-60 MIN CONTRAST:  None. FLUOROSCOPY TIME:  Fluoroscopy Time:  2 minutes 28 seconds. Number of Acquired Spot Images: 6. COMPARISON:  None. FINDINGS: Six intraoperative fluoroscopic images of the left femur demonstrate the patient be status post intramedullary rod fixation of proximal left femoral shaft fracture. Good alignment of fracture components is noted. IMPRESSION: Fluoroscopic guidance provided during open reduction and internal fixation of proximal left femoral fracture. Electronically Signed   By: Lupita Raider M.D.   On: 05/18/2019 16:17   Dg Hip Operative Unilat W Or W/o Pelvis Left   Result Date: 05/18/2019 CLINICAL DATA:  ORIF left femur EXAM: OPERATIVE LEFT HIP (WITH PELVIS IF PERFORMED) 6 VIEWS TECHNIQUE: Fluoroscopic spot image(s) were submitted for interpretation post-operatively. COMPARISON:  05/17/2019. FINDINGS: Proximal left femoral fracture again noted. ORIF. Hardware intact. Near anatomic alignment. IMPRESSION: ORIF left femur. Electronically Signed   By: Maisie Fus  Register   On: 05/18/2019 16:18   Dg Hip Unilat W Or Wo Pelvis 2-3 Views Left  Result Date: 05/17/2019 CLINICAL DATA:  Left hip pain after mechanical fall today. EXAM: DG HIP (WITH OR WITHOUT PELVIS) 2-3V LEFT COMPARISON:  None. FINDINGS: Comminuted and displaced left proximal femur fracture involving the lesser trochanter and subtrochanteric femur. There is apex lateral angulation and mild proximal migration of the femoral shaft. Femoral head remains seated. Pubic rami are intact. Degenerative change of the pubic symphysis and sacroiliac joints. IMPRESSION: Comminuted and displaced left proximal femur fracture involving the lesser trochanter and subtrochanteric femur. Electronically Signed   By: Narda Rutherford M.D.   On: 05/17/2019 20:45   Dg Femur Min 2 Views Left  Result Date: 05/18/2019 CLINICAL DATA:  Post ORIF left femur fracture. EXAM: LEFT FEMUR 2 VIEWS COMPARISON:  Preoperative radiographs yesterday. FINDINGS: Intramedullary nail with distal locking and trans trochanteric screw fixation of comminuted proximal femur fracture. Persistent displacement of the lesser trochanter. Overall improved fracture alignment compared to preoperative imaging. No new fracture or periprosthetic lucency. Recent postsurgical change in the soft tissues occludes air and edema. IMPRESSION: Post ORIF of comminuted proximal femur fracture. No immediate postoperative complication. Electronically Signed   By: Narda Rutherford M.D.   On: 05/18/2019 20:31    Scheduled Meds: . acetaminophen  500 mg Oral Q8H  . docusate sodium  100  mg Oral BID  . enoxaparin (LOVENOX) injection  40 mg Subcutaneous Q24H  . folic acid  1 mg Oral Daily  . levothyroxine  50 mcg Oral Q0600  . polyethylene glycol  17 g Oral Daily  . propranolol  40 mg Oral BID   Continuous Infusions: . 0.9 % NaCl with KCl 20 mEq / L 50 mL/hr at 05/19/19 0300  . lactated ringers 10 mL/hr at 05/18/19 1241     LOS: 2 days   Time spent: 27 minutes   Hughie Closs, MD [facility] Pager [phone]  If 7PM-7AM, please contact night-coverage [email or web address] Password [facility] 05/19/2019, 7:58 AM

## 2019-05-19 NOTE — Care Management (Signed)
CM consult acknowledged to assist with any HH/DME needs. Awaiting PT/OT eval for DCP recommendations and will continue to follow.  Drianna Chandran RN, BSN, NCM-BC, ACM-RN [phone] 

## 2019-05-19 NOTE — Progress Notes (Signed)
Orthopedic Trauma Service Progress Note  Patient ID: Virginia Wright MRN: [id number] DOB/AGE: February 05, 1933 85 y.o.  Subjective:  Doing well Sore  Hungry, waiting for breakfast   Had some norco last night  Pain about 5/10    Review of Systems  Constitutional: Negative for chills and fever.  Respiratory: Negative for shortness of breath and wheezing.   Cardiovascular: Negative for chest pain and palpitations.  Gastrointestinal: Negative for nausea and vomiting.  Neurological: Negative for tingling and sensory change.    Objective:   VITALS:   Vitals:   05/18/19 1930 05/18/19 2235 05/19/19 0432 05/19/19 0805  BP: (!) 116/52 134/62 (!) 133/55 105/78  Pulse: 75 79 70 76  Resp: 18  20 16   Temp: 98.4 F (36.9 C)  97.8 F (36.6 C) 98.2 F (36.8 C)  TempSrc: Oral  Oral Oral  SpO2: 99%  99% 99%  Weight: 67.1 kg     Height: 5\' 2"  (1.575 m)       Estimated body mass index is 27.06 kg/m as calculated from the following:   Height as of this encounter: 5\' 2"  (1.575 m).   Weight as of this encounter: 67.1 kg.   Intake/Output      09/22 0701 - 09/23 0700 09/23 0701 - 09/24 0700   I.V. (mL/kg) 1694.1 (25.2)    IV Piggyback 50    Total Intake(mL/kg) 1744.1 (26)    Urine (mL/kg/hr) 150 (0.1)    Blood 200    Total Output 350    Net +1394.1           LABS  Results for orders placed or performed during the hospital encounter of 05/17/19 (from the past 24 hour(s))  SARS Coronavirus 2 [facility] order, Performed in [facility] hospital lab) Nasopharyngeal Nasopharyngeal Swab     Status: None   Collection Time: 05/18/19 10:00 AM   Specimen: Nasopharyngeal Swab  Result Value Ref Range   SARS Coronavirus 2 NEGATIVE NEGATIVE  CBC     Status: Abnormal   Collection Time: 05/18/19  8:29 PM  Result Value Ref Range   WBC 8.8 4.0 - 10.5 K/uL   RBC 2.51 (L) 3.87 - 5.11 MIL/uL   Hemoglobin 9.3 (L) 12.0 - 15.0  g/dL   HCT [facility] (L) 05/20/19 - 05/20/19 %   MCV 108.8 (H) 80.0 - 100.0 fL   MCH 37.1 (H) 26.0 - 34.0 pg   MCHC 34.1 30.0 - 36.0 g/dL   RDW 22.6 33.3 - 54.5 %   Platelets 38 (L) 150 - 400 K/uL   nRBC 0.0 0.0 - 0.2 %  CBC     Status: Abnormal   Collection Time: 05/19/19  1:03 AM  Result Value Ref Range   WBC 8.3 4.0 - 10.5 K/uL   RBC 2.25 (L) 3.87 - 5.11 MIL/uL   Hemoglobin 8.3 (L) 12.0 - 15.0 g/dL   HCT 63.8 (L) 93.7 - 05/21/19 %   MCV 110.2 (H) 80.0 - 100.0 fL   MCH 36.9 (H) 26.0 - 34.0 pg   MCHC 33.5 30.0 - 36.0 g/dL   RDW 34.2 87.6 - 81.1 %   Platelets 85 (L) 150 - 400 K/uL   nRBC 0.0 0.0 - 0.2 %  Basic metabolic panel     Status: Abnormal   Collection Time: 05/19/19  1:03 AM  Result Value Ref Range   Sodium 137 135 - 145 mmol/L   Potassium 5.2 (H) 3.5 - 5.1 mmol/L   Chloride 104 98 - 111 mmol/L   CO2 24 22 - 32 mmol/L   Glucose, Bld 210 (H) 70 - 99 mg/dL   BUN 26 (H) 8 - 23 mg/dL   Creatinine, Ser 1.57 (H) 0.44 - 1.00 mg/dL   Calcium 8.1 (L) 8.9 - 10.3 mg/dL   GFR calc non Af Amer 30 (L) >60 mL/min   GFR calc Af Amer 35 (L) >60 mL/min   Anion gap 9 5 - 15     PHYSICAL EXAM:   Gen: resting comfortably in bed, NAD, appears well  Lungs: unlabored Cardiac: regular  Ext:       Left Lower Extremity   Dressings stable   Ext warm   + DP pulse  Distal motor and sensory functions intact  Swelling stable   Assessment/Plan: 1 Day Post-Op   Principal Problem:   Hip fracture (HCC) Active Problems:   Thrombocytopenia (HCC)   Elevated serum creatinine   Serum total bilirubin elevated   Essential hypertension   Anti-infectives (From admission, onward)   Start     Dose/Rate Route Frequency Ordered Stop   05/18/19 2000  clindamycin (CLEOCIN) IVPB 600 mg     600 mg 100 mL/hr over 30 Minutes Intravenous Every 6 hours 05/18/19 1927 05/19/19 0248   05/18/19 1400  clindamycin (CLEOCIN) IVPB 900 mg     900 mg 100 mL/hr over 30 Minutes Intravenous To Surgery 05/18/19 1357 05/18/19  1420    .  POD/HD#: 1  8 85  y/o female s/p fall with L hip fracture   - fall   - L hip fracture s/p IMN   WBAT with assistance  ROM as tolerated L hip and knee  Ice prn   Dressing change tomorrow or Friday   PT/OT    Discharge venue TBD   - Pain management:  Scheduled tylenol  Norco for severe pain   - ABL anemia/Hemodynamics  Modest drop this am   CBC in am   May need PRBCs   No history of CAD/vascular disease      Elevated kidney function    Mild increase in BUN and CR   Suspect it is pre-renal from hypovolemia      Pt has not had anything to eat or drink in over 24 hours    Will give 500 cc bolus of NS    ABL anemia likely contributory     Will transfuse if hgb <8 and renal function worse tomorrow     - Medical issues   Per primary   - DVT/PE prophylaxis:  SCDs  Hold on lovenox as platelets are <100k  - ID:   periop abx  - Metabolic Bone Disease:  Labs pending   Fracture mechanism indicative of osteoporosis   DEXA in 4-8 weeks  - Activity:  WBAT   Up with assistance   - FEN/GI prophylaxis/Foley/Lines:  Reg diet diet   - Impediments to fracture healing:  Suspected osteoporosis   - Dispo:  Therapy evals  Dc venue TBD     Jari Pigg, PA-C [phone] (C) 05/19/2019, 8:20 AM  [facility] [place] [address] [phone] Domingo Sep (F)

## 2019-05-19 NOTE — Evaluation (Signed)
Occupational Therapy Evaluation Patient Details Name: Virginia Wright MRN: [id number] DOB: 1932-10-15 Today's Date: 05/19/2019    History of Present Illness Pt is an 85 year old woman admitted after falling while trimming her bushes resulting in L hip fx. Underwent IM nail 05/18/19. PMH: asthma, HTN, RA, hypothyroidism, tremor, thrombocytopenia.   Clinical Impression   Pt typically functions independently. She presents with post operative hip pain, generalized weakness and decreased standing balance. She mobilized OOB today with min assist. She requires up to max assist for ADL. Recommending ST rehab in SNF, but pt may opt to go to her son's home. Will follow acutely.    Follow Up Recommendations  SNF;Supervision/Assistance - 24 hour    Equipment Recommendations  3 in 1 bedside commode    Recommendations for Other Services       Precautions / Restrictions Precautions Precautions: Fall Restrictions Weight Bearing Restrictions: Yes LLE Weight Bearing: Weight bearing as tolerated      Mobility Bed Mobility Overal bed mobility: Needs Assistance Bed Mobility: Supine to Sit;Sit to Supine     Supine to sit: Min assist Sit to supine: Min assist   General bed mobility comments: cues for technique, assist for L LE, increased time, use of rail  Transfers Overall transfer level: Needs assistance Equipment used: Rolling walker (2 wheeled) Transfers: Sit to/from [facility] Sit to Stand: Min assist Stand pivot transfers: Min assist       General transfer comment: cues for technique, assist to rise and steady, increased time    Balance Overall balance assessment: Needs assistance   Sitting balance-Leahy Scale: Fair     Standing balance support: Bilateral upper extremity supported Standing balance-Leahy Scale: Poor                             ADL either performed or assessed with clinical judgement   ADL Overall ADL's : Needs  assistance/impaired Eating/Feeding: Independent;Bed level   Grooming: Wash/dry hands;Sitting;Set up   Upper Body Bathing: Set up;Sitting   Lower Body Bathing: Maximal assistance;Sit to/from stand   Upper Body Dressing : Set up;Sitting   Lower Body Dressing: Maximal assistance;Sit to/from stand   Toilet Transfer: Minimal assistance;Stand-pivot;BSC;RW   Toileting- Clothing Manipulation and Hygiene: Set up;Sitting/lateral lean Toileting - Clothing Manipulation Details (indicate cue type and reason): pericare             Vision Baseline Vision/History: Wears glasses Wears Glasses: Reading only Patient Visual Report: No change from baseline       Perception     Praxis      Pertinent Vitals/Pain       Hand Dominance Right   Extremity/Trunk Assessment Upper Extremity Assessment Upper Extremity Assessment: RUE deficits/detail;LUE deficits/detail RUE Deficits / Details: tremor LUE Deficits / Details: tremor   Lower Extremity Assessment Lower Extremity Assessment: Defer to PT evaluation   Cervical / Trunk Assessment Cervical / Trunk Assessment: Kyphotic   Communication Communication Communication: No difficulties   Cognition Arousal/Alertness: Awake/alert Behavior During Therapy: WFL for tasks assessed/performed Overall Cognitive Status: Within Functional Limits for tasks assessed                                     General Comments       Exercises     Shoulder Instructions      Home Living Family/patient expects to be discharged to:: Private  residence Living Arrangements: Alone   Type of Home: House Home Access: Stairs to enter [employer] of Steps: 2   Home Layout: Two level;Able to live on main level with bedroom/bathroom     Bathroom Shower/Tub: Tub/shower unit   Bathroom Toilet: Handicapped height     Home Equipment: Grab bars - toilet;Grab bars - tub/shower;Walker - 2 wheels          Prior  Functioning/Environment Level of Independence: Independent        Comments: drives locally        OT Problem List: Decreased strength;Impaired balance (sitting and/or standing);Decreased knowledge of use of DME or AE;Pain      OT Treatment/Interventions: Self-care/ADL training;DME and/or AE instruction;Patient/family education;Balance training;Therapeutic activities    OT Goals(Current goals can be found in the care plan section) Acute Rehab OT Goals Patient Stated Goal: walk OT Goal Formulation: With patient Potential to Achieve Goals: Good ADL Goals Pt Will Perform Grooming: with supervision;standing Pt Will Perform Lower Body Bathing: with supervision;with adaptive equipment;sit to/from stand Pt Will Perform Lower Body Dressing: with supervision;with adaptive equipment;sit to/from stand Pt Will Transfer to Toilet: with supervision;ambulating;bedside commode Pt Will Perform Toileting - Clothing Manipulation and hygiene: with supervision;sit to/from stand Additional ADL Goal #1: Pt will perfom bed mobility modified independently in preparation for ADL.  OT Frequency: Min 2X/week   Barriers to D/C: Decreased caregiver support          Co-evaluation              AM-PAC OT "6 Clicks" Daily Activity     Outcome Measure Help from another person eating meals?: None Help from another person taking care of personal grooming?: A Little Help from another person toileting, which includes using toliet, bedpan, or urinal?: A Lot Help from another person bathing (including washing, rinsing, drying)?: A Lot Help from another person to put on and taking off regular upper body clothing?: None Help from another person to put on and taking off regular lower body clothing?: A Lot 6 Click Score: 17   End of Session Equipment Utilized During Treatment: Gait belt;Rolling walker Nurse Communication: Mobility status  Activity Tolerance: Patient tolerated treatment well Patient left: in  bed;with call bell/phone within reach;with family/visitor present  OT Visit Diagnosis: Unsteadiness on feet (R26.81);Other abnormalities of gait and mobility (R26.89);Muscle weakness (generalized) (M62.81);Pain;History of falling (Z91.81)                Time: 1150-1220 OT Time Calculation (min): 30 min Charges:  OT General Charges $OT Visit: 1 Visit OT Evaluation $OT Eval Moderate Complexity: 1 Mod OT Treatments $Self Care/Home Management : 8-22 mins  Martie Round, OTR/L Acute Rehabilitation Services Pager: [phone] Office: [phone]  Evern Bio 05/19/2019, 1:17 PM

## 2019-05-19 NOTE — Plan of Care (Signed)

## 2019-05-19 NOTE — Evaluation (Signed)
Physical Therapy Evaluation Patient Details Name: Virginia Wright MRN: [id number] DOB: 1932/11/21 Today's Date: 05/19/2019   History of Present Illness  Pt is an 85 year old woman admitted after falling while trimming her bushes resulting in L hip fx. Underwent IM nail 05/18/19. PMH: asthma, HTN, RA, hypothyroidism, tremor, thrombocytopenia.  Clinical Impression  Pt was able to walk a short distance in her room with RW and min assist overall.  She is very unsure on her feet.  Son, Virginia Wright, was present throughout the session and reports if she can get to where she can get up with supervision or very little assist they would be comfortable with her coming home to his house while she recovers.  As of right now (today), she would need SNF level rehab (requesting [facility]) at discharge.   PT to follow acutely for deficits listed below.      Follow Up Recommendations SNF(requesting [facility] to be closer to her son)    Equipment Recommendations  3in1 (PT)    Recommendations for Other Services   NA    Precautions / Restrictions Precautions Precautions: Fall Restrictions LLE Weight Bearing: Weight bearing as tolerated      Mobility  Bed Mobility Overal bed mobility: Needs Assistance Bed Mobility: Supine to Sit     Supine to sit: Min assist     General bed mobility comments: Min assist to help progress left leg to EOB.   Transfers Overall transfer level: Needs assistance Equipment used: Rolling walker (2 wheeled) Transfers: Sit to/from Stand Sit to Stand: Min assist         General transfer comment: Min assist to stand from EOB.  Assist needed to power up to standing with cues for safe hand placement.   Ambulation/Gait Ambulation/Gait assistance: Min guard Gait Distance (Feet): 8 Feet Assistive device: Rolling walker (2 wheeled) Gait Pattern/deviations: Step-to pattern;Antalgic     General Gait Details: Pt with moderately antalgic gait pattern, verbal cues for correct LE  sequencing and safe RW use.          Balance Overall balance assessment: Needs assistance Sitting-balance support: Feet supported;No upper extremity supported Sitting balance-Leahy Scale: Fair     Standing balance support: Bilateral upper extremity supported Standing balance-Leahy Scale: Poor Standing balance comment: needs support from RW and PT                             Pertinent Vitals/Pain Pain Assessment: Faces Faces Pain Scale: Hurts even more Pain Location: left leg Pain Descriptors / Indicators: Grimacing;Guarding Pain Intervention(s): Limited activity within patient's tolerance;Monitored during session;Repositioned    Home Living Family/patient expects to be discharged to:: Private residence Living Arrangements: Children(if she goes home she will go to son's house) Available Help at Discharge: Family;Available 24 hours/day(son works from home) Type of Home: House Home Access: Stairs to enter Entrance Stairs-Rails: None Secretary/administrator of Steps: 2 Home Layout: Two level;Able to live on main level with bedroom/bathroom        Prior Function Level of Independence: Independent         Comments: drives, gardens     Hand Dominance   Dominant Hand: Right    Extremity/Trunk Assessment   Upper Extremity Assessment Upper Extremity Assessment: Defer to OT evaluation    Lower Extremity Assessment Lower Extremity Assessment: LLE deficits/detail LLE Deficits / Details: left leg with normal post op pain and weakness, ankle 3/5, knee 3-/5, hip 2/5  Cervical / Trunk Assessment Cervical / Trunk Assessment: Kyphotic  Communication   Communication: No difficulties  Cognition Arousal/Alertness: Awake/alert Behavior During Therapy: WFL for tasks assessed/performed Overall Cognitive Status: Within Functional Limits for tasks assessed                                           Exercises Total Joint Exercises Ankle  Circles/Pumps: AROM;Both;20 reps Quad Sets: AROM;Left;5 reps Heel Slides: AAROM;Left;10 reps Hip ABduction/ADduction: AAROM;Left;10 reps   Assessment/Plan    PT Assessment Patient needs continued PT services  PT Problem List Decreased strength;Decreased range of motion;Decreased activity tolerance;Decreased balance;Decreased mobility;Decreased knowledge of use of DME;Pain       PT Treatment Interventions DME instruction;Gait training;Stair training;Functional mobility training;Therapeutic activities;Therapeutic exercise;Balance training;Neuromuscular re-education;Patient/family education;Manual techniques;Modalities    PT Goals (Current goals can be found in the Care Plan section)  Acute Rehab PT Goals Patient Stated Goal: to return to her independence, gardening PT Goal Formulation: With patient/family Time For Goal Achievement: 05/26/19 Potential to Achieve Goals: Good    Frequency Min 5X/week        AM-PAC PT "6 Clicks" Mobility  Outcome Measure Help needed turning from your back to your side while in a flat bed without using bedrails?: A Little Help needed moving from lying on your back to sitting on the side of a flat bed without using bedrails?: A Little Help needed moving to and from a bed to a chair (including a wheelchair)?: A Little Help needed standing up from a chair using your arms (e.g., wheelchair or bedside chair)?: A Little Help needed to walk in hospital room?: A Little Help needed climbing 3-5 steps with a railing? : A Lot 6 Click Score: 17    End of Session Equipment Utilized During Treatment: Gait belt Activity Tolerance: Patient limited by pain Patient left: in chair;with call bell/phone within reach;with family/visitor present   PT Visit Diagnosis: Muscle weakness (generalized) (M62.81);Difficulty in walking, not elsewhere classified (R26.2);Pain Pain - Right/Left: Left Pain - part of body: Hip    Time: 4496-7591 PT Time Calculation (min) (ACUTE  ONLY): 36 min   Charges:          Wells Guiles B. Acquanetta Cabanilla, PT, DPT  Acute Rehabilitation [phone] pager #[phone] office  @ [facility]: [phone]   PT Evaluation $PT Eval Moderate Complexity: 1 Mod PT Treatments $Therapeutic Activity: 8-22 mins        05/19/2019, 6:34 PM

## 2019-05-19 NOTE — Progress Notes (Signed)
Initial Nutrition Assessment  DOCUMENTATION CODES:   Not applicable  INTERVENTION:  Provide Ensure Enlive po once daily, each supplement provides 350 kcal and 20 grams of protein.  Encourage adequate PO intake.   NUTRITION DIAGNOSIS:   Increased nutrient needs related to post-op healing as evidenced by estimated needs.  GOAL:   Patient will meet greater than or equal to 90% of their needs  MONITOR:   PO intake, Supplement acceptance, Skin, Weight trends, Labs, I & O's  REASON FOR ASSESSMENT:   Consult Hip fracture protocol  ASSESSMENT:   85 y.o. female with medical history significant of rheumatoid arthritis, chronic thrombocytopenia, hypertension, hypothyroidism presenting to the hospital via EMS for evaluation of left hip and thigh pain after a fall.  Procedure (9/22): LEFT HIP FRACTURE FIXATION  Meal completion 100%. Pt reports having a good appetite currently and PTA with no difficulties. Pt with no significant weight loss. RD to provide nutritional supplements to aid in post op healing.   NUTRITION - FOCUSED PHYSICAL EXAM:    Most Recent Value  Orbital Region  No depletion  Upper Arm Region  No depletion  Thoracic and Lumbar Region  No depletion  Buccal Region  No depletion  Temple Region  No depletion  Clavicle Bone Region  No depletion  Clavicle and Acromion Bone Region  No depletion  Scapular Bone Region  No depletion  Dorsal Hand  No depletion  Patellar Region  No depletion  Anterior Thigh Region  No depletion  Posterior Calf Region  No depletion  Edema (RD Assessment)  Mild  Hair  Reviewed  Eyes  Reviewed  Mouth  Reviewed  Skin  Reviewed  Nails  Reviewed     Labs and medications reviewed.   Diet Order:   Diet Order            Diet regular Room service appropriate? Yes; Fluid consistency: Thin  Diet effective now              EDUCATION NEEDS:   Not appropriate for education at this time  Skin:  Skin Assessment: Skin Integrity  Issues: Skin Integrity Issues:: Incisions Incisions: L hip  Last BM:  9/21  Height:   Ht Readings from Last 1 Encounters:  05/18/19 5\' 2"  (1.575 m)    Weight:   Wt Readings from Last 1 Encounters:  05/18/19 67.1 kg    Ideal Body Weight:  50 kg  BMI:  Body mass index is 27.06 kg/m.  Estimated Nutritional Needs:   Kcal:  1650-1850  Protein:  75-90 grams  Fluid:  1.6 - 1.8 L/day    Corrin Parker, MS, RD, LDN Pager # [phone] After hours/ weekend pager # [phone]

## 2019-05-20 LAB — BASIC METABOLIC PANEL
Anion gap: 8 (ref 5–15)
BUN: 25 mg/dL — ABNORMAL HIGH (ref 8–23)
CO2: 23 mmol/L (ref 22–32)
Calcium: 7.9 mg/dL — ABNORMAL LOW (ref 8.9–10.3)
Chloride: 107 mmol/L (ref 98–111)
Creatinine, Ser: 1.11 mg/dL — ABNORMAL HIGH (ref 0.44–1.00)
GFR calc Af Amer: 53 mL/min — ABNORMAL LOW (ref >60)
GFR calc non Af Amer: 46 mL/min — ABNORMAL LOW (ref >60)
Glucose, Bld: 122 mg/dL — ABNORMAL HIGH (ref 70–99)
Potassium: 4.6 mmol/L (ref 3.5–5.1)
Sodium: 138 mmol/L (ref 135–145)

## 2019-05-20 LAB — CBC
HCT: 18.1 % — ABNORMAL LOW (ref 36.0–46.0)
Hemoglobin: 6 g/dL — CL (ref 12.0–15.0)
MCH: 36.8 pg — ABNORMAL HIGH (ref 26.0–34.0)
MCHC: 33.1 g/dL (ref 30.0–36.0)
MCV: 111 fL — ABNORMAL HIGH (ref 80.0–100.0)
Platelets: 73 10*3/uL — ABNORMAL LOW (ref 150–400)
RBC: 1.63 MIL/uL — ABNORMAL LOW (ref 3.87–5.11)
RDW: 15.2 % (ref 11.5–15.5)
WBC: 5.8 10*3/uL (ref 4.0–10.5)
nRBC: 1.2 % — ABNORMAL HIGH (ref 0.0–0.2)

## 2019-05-20 LAB — CALCITRIOL (1,25 DI-OH VIT D): Vit D, 1,25-Dihydroxy: 32.8 pg/mL (ref 19.9–79.3)

## 2019-05-20 LAB — ABO/RH: ABO/RH(D): A NEG

## 2019-05-20 LAB — PREPARE RBC (CROSSMATCH)

## 2019-05-20 LAB — HEMOGLOBIN AND HEMATOCRIT, BLOOD
HCT: 29.8 % — ABNORMAL LOW (ref 36.0–46.0)
Hemoglobin: 10.5 g/dL — ABNORMAL LOW (ref 12.0–15.0)

## 2019-05-20 LAB — VITAMIN D 25 HYDROXY (VIT D DEFICIENCY, FRACTURES): Vit D, 25-Hydroxy: 21.8 ng/mL — ABNORMAL LOW (ref 30.0–100.0)

## 2019-05-20 MED ORDER — POLYETHYLENE GLYCOL 3350 17 G PO PACK: 17.0000 g | PACK | Freq: Every day | ORAL | Status: DC

## 2019-05-20 MED ORDER — BISACODYL 10 MG RE SUPP
10.0000 mg | Freq: Once | RECTAL | Status: AC
  Administered 2019-05-20: 16:00:00 10 mg via RECTAL
  Filled 2019-05-20: qty 1

## 2019-05-20 MED ORDER — SODIUM CHLORIDE 0.9% IV SOLUTION
Freq: Once | INTRAVENOUS | Status: AC
  Administered 2019-05-20: 07:00:00 via INTRAVENOUS

## 2019-05-20 NOTE — TOC Initial Note (Addendum)
Transition of Care [facility]) - Initial/Assessment Note    Patient Details  Name: Virginia Wright MRN: [id number] Date of Birth: 05-05-1933  [facility]) CM/SW Contact:    Midge Minium RN, BSN, NCM-BC, ACM-RN [phone] Phone Number: 05/20/2019, 3:03 PM  Clinical Narrative:                 CM following for dispositional needs. CM met with patient/son with CM role explained. Patient is an 85 yo female admitted after a fall resulting in a L hip fx; s/p IM nailing. Patient lived at home alone, independent with ADLs with AD for use. PT/OT eval complete with ST SNF recommended with patient/son agreeable to a facility in the [place] area. CMS SNF Compare list provided, with son to review for preference. FL2 and PASRR completed with bed offers discussed with patient/son. Patient will require insurance auth once preference is determine. CM team will continue to follow.   Addendum: 05/20/19 @ 1547-Can Lucci RNCM- CM spoke to patients son with ST SNF preference: [facility]. CM left a VM with Lindajo Royal (facility liaison) requesting a return call to discuss initiating insurance auth/clarify if COVID test is needed.   Expected Discharge Plan: Skilled Nursing Facility Barriers to Discharge: Continued Medical Work up, Ship broker   Patient Goals and CMS Choice Patient states their goals for this hospitalization and ongoing recovery are:: "to get better" CMS Medicare.gov Compare Post Acute Care list provided to:: Patient Represenative (must comment)(CMS SNF Compare list provided to patients son as requested) Choice offered to / list presented to : Adult Children(CMS SNF Compare list provided to patients son as requested)  Expected Discharge Plan and Services Expected Discharge Plan: [place] In-house Referral: NA Discharge Planning Services: CM Consult Post Acute Care Choice: [place] Living arrangements for the past 2 months: Single  Family Home                 DME Arranged: N/A DME Agency: NA       HH Arranged: NA [place] Agency: NA        Prior Living Arrangements/Services Living arrangements for the past 2 months: Single Family Home Lives with:: Self Patient language and need for interpreter reviewed:: Yes Do you feel safe going back to the place where you live?: Yes      Need for Family Participation in Patient Care: Yes (Comment) Care giver support system in place?: Yes (comment) Current home services: DME Criminal Activity/Legal Involvement Pertinent to Current Situation/Hospitalization: No - Comment as needed  Activities of Daily Living Home Assistive Devices/Equipment: None ADL Screening (condition at time of admission) Patient's cognitive ability adequate to safely complete daily activities?: Yes Is the patient deaf or have difficulty hearing?: No Does the patient have difficulty seeing, even when wearing glasses/contacts?: No Does the patient have difficulty concentrating, remembering, or making decisions?: No Patient able to express need for assistance with ADLs?: Yes Does the patient have difficulty dressing or bathing?: No Independently performs ADLs?: Yes (appropriate for developmental age) Does the patient have difficulty walking or climbing stairs?: No Weakness of Legs: None Weakness of Arms/Hands: None  Permission Sought/Granted Permission sought to share information with : Case Manager, Customer service manager, Family Supports Permission granted to share information with : Yes, Verbal Permission Granted  Share Information with NAME: Virginia Wright  Permission granted to share info w AGENCY: SNF facilities  Permission granted to share info w Relationship: son  Permission granted to share info w Contact  Information: [phone]  Emotional Assessment Appearance:: Appears stated age Attitude/Demeanor/Rapport: Gracious Affect (typically observed): Accepting, Calm,  Pleasant Orientation: : Oriented to Situation, Oriented to  Time, Oriented to Place, Oriented to Self Alcohol / Substance Use: Not Applicable Psych Involvement: No (comment)  Admission diagnosis:  Closed fracture of left hip, initial encounter ([place]) [S72.002A] Closed left subtrochanteric femur fracture, initial encounter [facility]) [S72.22XA] Patient Active Problem List   Diagnosis Date Noted  . Elevated serum creatinine 05/18/2019  . Essential hypertension 05/18/2019  . Hip fracture ([place]) 05/17/2019  . Thrombocytopenia ([place]) 08/27/2016  . Essential tremor 10/04/2013  . Arthritis, rheumatoid ([place])    PCP:  Peggyann Juba, NP Pharmacy:   [facility] [place], [place] [address] [place] [address] Phone: [phone] Fax: [phone]     Social Determinants of Health (SDOH) Interventions    Readmission Risk Interventions No flowsheet data found.

## 2019-05-20 NOTE — [place] FL2 (Signed)
[place] MEDICAID FL2 LEVEL OF CARE SCREENING TOOL     IDENTIFICATION  Patient Name: Virginia Wright Birthdate: April 04, 1933 Sex: female Admission Date (Current Location): 05/17/2019  [facility] and [place] Number:  Producer, television/film/video and Address:  The . [facility], [address] [place], [place] [address]      Provider Number: [id number]  Attending Physician Name and Address:  Hughie Closs, MD  Relative Name and Phone Number:  Elzora Cullins [phone]    Current Level of Care: Hospital Recommended Level of Care: Skilled Nursing Facility Prior Approval Number:    Date Approved/Denied:   PASRR Number: [id number]  Discharge Plan: SNF    Current Diagnoses: Patient Active Problem List   Diagnosis Date Noted  . Elevated serum creatinine 05/18/2019  . Essential hypertension 05/18/2019  . Hip fracture (HCC) 05/17/2019  . Thrombocytopenia (HCC) 08/27/2016  . Essential tremor 10/04/2013  . Arthritis, rheumatoid (HCC)     Orientation RESPIRATION BLADDER Height & Weight     Self, Time, Situation, Place  Normal Continent Weight: 67.1 kg Height:  5\' 2"  (157.5 cm)  BEHAVIORAL SYMPTOMS/MOOD NEUROLOGICAL BOWEL NUTRITION STATUS  (N/A) (N/A) Continent Diet  AMBULATORY STATUS COMMUNICATION OF NEEDS Skin   Limited Assist Verbally Surgical wounds(Left Hip)                       Personal Care Assistance Level of Assistance  Bathing, Feeding, Dressing Bathing Assistance: Maximum assistance Feeding assistance: Limited assistance Dressing Assistance: Maximum assistance     Functional Limitations Info  Sight, Hearing, Speech Sight Info: Adequate Hearing Info: Adequate Speech Info: Adequate    SPECIAL CARE FACTORS FREQUENCY  PT (By licensed PT), OT (By licensed OT)     PT Frequency: 5x/week OT Frequency: 2x/week            Contractures Contractures Info: Not present    Additional Factors Info  Code Status, Allergies Code Status Info:  Full Code Allergies Info: Celecoxib; Clarithromycin; Cefdinir; Codeine; Gatifloxacin; Hydroxychloroquine           Current Medications (05/20/2019):  This is the current hospital active medication list Current Facility-Administered Medications  Medication Dose Route Frequency Provider Last Rate Last Dose  . 0.9 %  sodium chloride infusion   Intravenous Continuous 05/22/2019, MD 50 mL/hr at 05/19/19 1121    . acetaminophen (TYLENOL) tablet 325-650 mg  325-650 mg Oral Q6H PRN 05/21/19, PA-C   650 mg at 05/20/19 0447  . albuterol (PROVENTIL) (2.5 MG/3ML) 0.083% nebulizer solution 3 mL  3 mL Inhalation Q4H PRN 05/22/19, PA-C      . cholecalciferol (VITAMIN D3) tablet 2,000 Units  2,000 Units Oral BID Montez Morita, PA-C   2,000 Units at 05/20/19 0805  . docusate sodium (COLACE) capsule 100 mg  100 mg Oral BID 05/22/19, PA-C   100 mg at 05/20/19 05/22/19  . feeding supplement (ENSURE ENLIVE) (ENSURE ENLIVE) liquid 237 mL  237 mL Oral Q1500 6270, MD   237 mL at 05/19/19 1818  . folic acid (FOLVITE) tablet 1 mg  1 mg Oral Daily 05/21/19, PA-C   1 mg at 05/20/19 0805  . hydrALAZINE (APRESOLINE) injection 10 mg  10 mg Intravenous Q6H PRN 05/22/19, PA-C      . HYDROcodone-acetaminophen (NORCO/VICODIN) 5-325 MG per tablet 1-2 tablet  1-2 tablet Oral Q6H PRN Montez Morita, PA-C   2 tablet at 05/20/19 1037  . lactated ringers infusion   Intravenous  Continuous Ainsley Spinner, PA-C 10 mL/hr at 05/18/19 1241    . levothyroxine (SYNTHROID) tablet 50 mcg  50 mcg Oral Q0600 Ainsley Spinner, PA-C   50 mcg at 05/20/19 0447  . menthol-cetylpyridinium (CEPACOL) lozenge 3 mg  1 lozenge Oral PRN Ainsley Spinner, PA-C       Or  . phenol (CHLORASEPTIC) mouth spray 1 spray  1 spray Mouth/Throat PRN Ainsley Spinner, PA-C      . metoCLOPramide (REGLAN) tablet 5-10 mg  5-10 mg Oral Q8H PRN Ainsley Spinner, PA-C       Or  . metoCLOPramide (REGLAN) injection 5-10 mg  5-10 mg Intravenous Q8H PRN Ainsley Spinner, PA-C      .  morphine 2 MG/ML injection 0.5-1 mg  0.5-1 mg Intravenous Q2H PRN Ainsley Spinner, PA-C   1 mg at 05/20/19 1042  . ondansetron (ZOFRAN) tablet 4 mg  4 mg Oral Q6H PRN Ainsley Spinner, PA-C       Or  . ondansetron [facility]) injection 4 mg  4 mg Intravenous Q6H PRN Ainsley Spinner, PA-C      . polyethylene glycol (MIRALAX / GLYCOLAX) packet 17 g  17 g Oral Daily Ainsley Spinner, PA-C   17 g at 05/20/19 0805  . propranolol (INDERAL) tablet 40 mg  40 mg Oral BID Ainsley Spinner, PA-C   40 mg at 05/19/19 2151  . vitamin C (ASCORBIC ACID) tablet 500 mg  500 mg Oral Daily Ainsley Spinner, PA-C   500 mg at 05/20/19 6606     Discharge Medications: Please see discharge summary for a list of discharge medications.  Relevant Imaging Results:  Relevant Lab Results:   Additional Information SSN: [id number]; High Falls Risk  Midge Minium RN, BSN, NCM-BC, ACM-RN [phone]

## 2019-05-20 NOTE — Progress Notes (Signed)
Physical Therapy Treatment Patient Details Name: Virginia Wright MRN: [id number] DOB: [birth year] Today's Date: 05/20/2019    History of Present Illness Pt is an 85 year old woman admitted after falling while trimming her bushes resulting in L hip fx. Underwent IM nail 05/18/19. PMH: asthma, HTN, RA, hypothyroidism, tremor, thrombocytopenia.    PT Comments    Pt was seen with nursing in room for mobility with BSC and to get to bed.  Pt is in mid-transfusion sequence, stopped for pt to be bathed and given time on BSC.  Follow up with her at lower frequency as SNF is planned, and will work on mobility of gait and balance with care for minimal tolerance of WB on LLE.  Pt is somewhat able to help with getting into bed today but her Hgb seems to be hindering the effort.  Follow on acutely and monitor her hgb and other labs to ensure pt is capable of exerting and will be able to make progress in therapy.   Follow Up Recommendations  SNF     Equipment Recommendations  3in1 (PT)    Recommendations for Other Services       Precautions / Restrictions Precautions Precautions: Fall Restrictions Weight Bearing Restrictions: Yes LLE Weight Bearing: Weight bearing as tolerated    Mobility  Bed Mobility Overal bed mobility: Needs Assistance Bed Mobility: Sit to Supine       Sit to supine: Mod assist      Transfers Overall transfer level: Needs assistance Equipment used: Rolling walker (2 wheeled);1 person hand held assist Transfers: Sit to/from Stand Sit to Stand: Mod assist;+2 physical assistance;+2 safety/equipment Stand pivot transfers: Mod assist       General transfer comment: greater effort but pt has hgb of 6.0  Ambulation/Gait             General Gait Details: unable to do this today   Stairs             Wheelchair Mobility    Modified Rankin (Stroke Patients Only)       Balance                                             Cognition Arousal/Alertness: Awake/alert Behavior During Therapy: WFL for tasks assessed/performed Overall Cognitive Status: Within Functional Limits for tasks assessed                                        Exercises      General Comments        Pertinent Vitals/Pain Pain Assessment: Faces Faces Pain Scale: Hurts little more Pain Location: left leg Pain Descriptors / Indicators: Operative site guarding Pain Intervention(s): Limited activity within patient's tolerance;Monitored during session;Premedicated before session;Repositioned    Home Living                      Prior Function            PT Goals (current goals can now be found in the care plan section) Acute Rehab PT Goals Patient Stated Goal: to return to her independence, gardening Progress towards PT goals: Not progressing toward goals - comment    Frequency    Min 2X/week      PT Plan Discharge plan needs to  be updated    Co-evaluation              AM-PAC PT "6 Clicks" Mobility   Outcome Measure  Help needed turning from your back to your side while in a flat bed without using bedrails?: A Lot Help needed moving from lying on your back to sitting on the side of a flat bed without using bedrails?: A Lot Help needed moving to and from a bed to a chair (including a wheelchair)?: A Lot Help needed standing up from a chair using your arms (e.g., wheelchair or bedside chair)?: A Lot Help needed to walk in hospital room?: A Lot Help needed climbing 3-5 steps with a railing? : Total 6 Click Score: 11    End of Session Equipment Utilized During Treatment: Gait belt Activity Tolerance: Patient limited by pain;Treatment limited secondary to medical complications (Comment) Patient left: in bed;with call bell/phone within reach;with bed alarm set Nurse Communication: Mobility status PT Visit Diagnosis: Muscle weakness (generalized) (M62.81);Difficulty in walking, not elsewhere  classified (R26.2);Pain Pain - Right/Left: Left Pain - part of body: Hip     Time: 1210-1236 PT Time Calculation (min) (ACUTE ONLY): 26 min  Charges:  $Therapeutic Activity: 23-37 mins                 Ivar Drape 05/20/2019, 5:25 PM   Samul Dada, PT MS Acute Rehab Dept. Number: [facility] [phone] and [facility] [phone]

## 2019-05-20 NOTE — Progress Notes (Signed)
PROGRESS NOTE    Virginia Wright  DJT:[id number] DOB: 11/17/1932 DOA: 05/17/2019 PCP: Chilton Greathouse, NP   Brief Narrative:  Virginia Wright is a 85 y.o. female with medical history significant of rheumatoid arthritis, chronic thrombocytopenia, hypertension, hypothyroidism presented to the hospital via EMS for evaluation of left hip and thigh pain after a fall.  Patient states that she was in her usual state of health and fell while doing gardening in her yard.upon arrival to ER, her vital signs were stable.  No leukocytosis.  X-ray of left hip showed comminuted and displaced left proximal femur fracture involving the lesser trochanter and subtrochanteric femur and she was subsequently admitted to hospital service and orthopedics consulted and she underwent IMN on 05/18/2019.  Assessment & Plan:   Principal Problem:   Hip fracture (HCC) Active Problems:   Thrombocytopenia (HCC)   Elevated serum creatinine   Essential hypertension   Left proximal femur fracture: Postop day 3 IMN, Pain management and rest of the management per orthopedics.  Essential hypertension: Controlled.  Continue propranolol and PRN hydralazine.  Amlodipine on hold.   Acute blood loss anemia: Hemoglobin dropped from 13.1 preop to 6.0 this morning postop.  2 units of PRBC transfusion ordered.  Repeat H&H posttransfusion and in the morning.  Hyperkalemia: Resolved.  Chronic thrombocytopenia: 73 today.  No obvious sign of bleeding.  Ortho to evaluate surgical wound.  Mild AKI: Creatinine improved.  Continue hydration.  Repeat labs in the morning.  Hypothyroidism: Continue Synthroid  Constipation: Treat with a Dulcolax suppository and MiraLAX.  DVT prophylaxis: SCDs for now.  Holding Lovenox for thrombocytopenia as recommended by orthopedics. Code Status: Full code Family Communication:  Plan of care discussed with patient.  Disposition Plan: PT assessment pending.  OT recommends SNF.  Case manager on board.   Consultants:   Orthopedics  Procedures:   IMN on 05/18/2019  Antimicrobials:   None   Subjective: Patient seen and examined. C/o pain in left hip. No other complaint/ has not had any BM post op. Passing flatus.  Objective: Vitals:   05/20/19 1150 05/20/19 1153 05/20/19 1313 05/20/19 1326  BP: (!) 127/47 (!) 127/47 (!) 113/40 (!) 115/48  Pulse: 65 66 71 74  Resp: 16  16 18   Temp: 99.3 F (37.4 C) 99.3 F (37.4 C) 98.4 F (36.9 C) 97.8 F (36.6 C)  TempSrc: Oral Oral Oral Oral  SpO2: 95% 96% 95% 96%  Weight:      Height:        Intake/Output Summary (Last 24 hours) at 05/20/2019 1406 Last data filed at 05/20/2019 0759 Gross per 24 hour  Intake 532.18 ml  Output -  Net 532.18 ml   Filed Weights   05/18/19 1355 05/18/19 1930  Weight: 68 kg 67.1 kg    Examination:  General exam: Appears calm and comfortable  Respiratory system: Clear to auscultation. Respiratory effort normal. Cardiovascular system: S1 & S2 heard, RRR. No JVD, murmurs, rubs, gallops or clicks. No pedal edema. Gastrointestinal system: Abdomen is nondistended, soft and nontender. No organomegaly or masses felt. Normal bowel sounds heard. Central nervous system: Alert and oriented. No focal neurological deficits. Extremities: Symmetric 5 x 5 power. Skin: No rashes, lesions or ulcers.  Psychiatry: Judgement and insight appear normal. Mood & affect appropriate.    Data Reviewed: I have personally reviewed following labs and imaging studies  CBC: Recent Labs  Lab 05/17/19 2101 05/18/19 2029 05/19/19 0103 05/19/19 1533 05/20/19 0308  WBC 6.3 8.8 8.3 9.3 5.8  NEUTROABS 4.9  --   --  7.5  --   HGB 13.1 9.3* 8.3* 7.3* 6.0*  HCT 37.2 27.3* 24.8* 20.8* 18.1*  MCV 106.9* 108.8* 110.2* 110.1* 111.0*  PLT 104* 38* 85* 92* 73*   Basic Metabolic Panel: Recent Labs  Lab 05/17/19 2101 05/18/19 0403 05/19/19 0103 05/20/19 0308  NA 138 139 137 138  K 5.2* 4.8 5.2* 4.6  CL 106 106 104 107  CO2  22 26 24 23   GLUCOSE 158* 156* 210* 122*  BUN 27* 25* 26* 25*  CREATININE 1.23* 1.12* 1.57* 1.11*  CALCIUM 9.0 8.5* 8.1* 7.9*   GFR: Estimated Creatinine Clearance: 33.9 mL/min (A) (by C-G formula based on SCr of 1.11 mg/dL (H)). Liver Function Tests: Recent Labs  Lab 05/17/19 2101 05/18/19 0403  AST 19  --   ALT 19  --   ALKPHOS 49  --   BILITOT 1.4* 0.9  PROT 6.4*  --   ALBUMIN 4.1  --    No results for input(s): LIPASE, AMYLASE in the last 168 hours. No results for input(s): AMMONIA in the last 168 hours. Coagulation Profile: Recent Labs  Lab 05/17/19 2101  INR 1.0   Cardiac Enzymes: No results for input(s): CKTOTAL, CKMB, CKMBINDEX, TROPONINI in the last 168 hours. BNP (last 3 results) No results for input(s): PROBNP in the last 8760 hours. HbA1C: No results for input(s): HGBA1C in the last 72 hours. CBG: No results for input(s): GLUCAP in the last 168 hours. Lipid Profile: No results for input(s): CHOL, HDL, LDLCALC, TRIG, CHOLHDL, LDLDIRECT in the last 72 hours. Thyroid Function Tests: No results for input(s): TSH, T4TOTAL, FREET4, T3FREE, THYROIDAB in the last 72 hours. Anemia Panel: No results for input(s): VITAMINB12, FOLATE, FERRITIN, TIBC, IRON, RETICCTPCT in the last 72 hours. Sepsis Labs: No results for input(s): PROCALCITON, LATICACIDVEN in the last 168 hours.  Recent Results (from the past 240 hour(s))  SARS CORONAVIRUS 2 (TAT 6-24 HRS) Nasopharyngeal Nasopharyngeal Swab     Status: None   Collection Time: 05/17/19 10:10 PM   Specimen: Nasopharyngeal Swab  Result Value Ref Range Status   SARS Coronavirus 2 NEGATIVE NEGATIVE Final    Comment: (NOTE) SARS-CoV-2 target nucleic acids are NOT DETECTED. The SARS-CoV-2 RNA is generally detectable in upper and lower respiratory specimens during the acute phase of infection. Negative results do not preclude SARS-CoV-2 infection, do not rule out co-infections with other pathogens, and should not be used as  the sole basis for treatment or other patient management decisions. Negative results must be combined with clinical observations, patient history, and epidemiological information. The expected result is Negative. Fact Sheet for Patients: SugarRoll.be Fact Sheet for Healthcare Providers: https://www.woods-mathews.com/ This test is not yet approved or cleared by the [place] FDA and  has been authorized for detection and/or diagnosis of SARS-CoV-2 by FDA under an Emergency Use Authorization (EUA). This EUA will remain  in effect (meaning this test can be used) for the duration of the COVID-19 declaration under Section 56 4(b)(1) of the Act, 21 U.S.C. section 360bbb-3(b)(1), unless the authorization is terminated or revoked sooner. Performed at [facility], [place] [address], [place], [place] [address]   SARS Coronavirus 2 [facility] order, Performed in [facility] hospital lab) Nasopharyngeal Nasopharyngeal Swab     Status: None   Collection Time: 05/18/19 10:00 AM   Specimen: Nasopharyngeal Swab  Result Value Ref Range Status   SARS Coronavirus 2 NEGATIVE NEGATIVE Final    Comment: (NOTE) If  result is NEGATIVE SARS-CoV-2 target nucleic acids are NOT DETECTED. The SARS-CoV-2 RNA is generally detectable in upper and lower  respiratory specimens during the acute phase of infection. The lowest  concentration of SARS-CoV-2 viral copies this assay can detect is 250  copies / mL. A negative result does not preclude SARS-CoV-2 infection  and should not be used as the sole basis for treatment or other  patient management decisions.  A negative result may occur with  improper specimen collection / handling, submission of specimen other  than nasopharyngeal swab, presence of viral mutation(s) within the  areas targeted by this assay, and inadequate number of viral copies  (<250 copies / mL). A negative result must be combined with clinical   observations, patient history, and epidemiological information. If result is POSITIVE SARS-CoV-2 target nucleic acids are DETECTED. The SARS-CoV-2 RNA is generally detectable in upper and lower  respiratory specimens dur ing the acute phase of infection.  Positive  results are indicative of active infection with SARS-CoV-2.  Clinical  correlation with patient history and other diagnostic information is  necessary to determine patient infection status.  Positive results do  not rule out bacterial infection or co-infection with other viruses. If result is PRESUMPTIVE POSTIVE SARS-CoV-2 nucleic acids MAY BE PRESENT.   A presumptive positive result was obtained on the submitted specimen  and confirmed on repeat testing.  While 2019 novel coronavirus  (SARS-CoV-2) nucleic acids may be present in the submitted sample  additional confirmatory testing may be necessary for epidemiological  and / or clinical management purposes  to differentiate between  SARS-CoV-2 and other Sarbecovirus currently known to infect humans.  If clinically indicated additional testing with an alternate test  methodology [phone]([id number]) is advised. The SARS-CoV-2 RNA is generally  detectable in upper and lower respiratory sp ecimens during the acute  phase of infection. The expected result is Negative. Fact Sheet for Patients:  BoilerBrush.com.cyhttps://www.fda.gov/media/136312/download Fact Sheet for Healthcare Providers: https://pope.com/https://www.fda.gov/media/136313/download This test is not yet approved or cleared by the [place] FDA and has been authorized for detection and/or diagnosis of SARS-CoV-2 by FDA under an Emergency Use Authorization (EUA).  This EUA will remain in effect (meaning this test can be used) for the duration of the COVID-19 declaration under Section 564(b)(1) of the Act, 21 U.S.C. section 360bbb-3(b)(1), unless the authorization is terminated or revoked sooner. Performed at [facility], [address]  [place], [place] [address]       Radiology Studies: Pelvis Portable  Result Date: 05/18/2019 CLINICAL DATA:  Left hip fracture, fixation EXAM: PORTABLE PELVIS 1-2 VIEWS COMPARISON:  05/17/2019 FINDINGS: Internal fixation across the proximal left femoral shaft fracture. Continued mild displacement of fracture fragments. No hardware complicating feature. IMPRESSION: Internal fixation as above. Electronically Signed   By: Charlett NoseKevin  Dover M.D.   On: 05/18/2019 20:10   Dg C-arm 1-60 Min  Result Date: 05/18/2019 CLINICAL DATA:  Open reduction and internal fixation of left femoral fracture. EXAM: DG C-ARM 1-60 MIN CONTRAST:  None. FLUOROSCOPY TIME:  Fluoroscopy Time:  2 minutes 28 seconds. Number of Acquired Spot Images: 6. COMPARISON:  None. FINDINGS: Six intraoperative fluoroscopic images of the left femur demonstrate the patient be status post intramedullary rod fixation of proximal left femoral shaft fracture. Good alignment of fracture components is noted. IMPRESSION: Fluoroscopic guidance provided during open reduction and internal fixation of proximal left femoral fracture. Electronically Signed   By: Lupita RaiderJames  Green Jr M.D.   On: 05/18/2019 16:17   Dg  Hip Operative Unilat W Or W/o Pelvis Left  Result Date: 05/18/2019 CLINICAL DATA:  ORIF left femur EXAM: OPERATIVE LEFT HIP (WITH PELVIS IF PERFORMED) 6 VIEWS TECHNIQUE: Fluoroscopic spot image(s) were submitted for interpretation post-operatively. COMPARISON:  05/17/2019. FINDINGS: Proximal left femoral fracture again noted. ORIF. Hardware intact. Near anatomic alignment. IMPRESSION: ORIF left femur. Electronically Signed   By: Maisie Fus  Register   On: 05/18/2019 16:18   Dg Femur Min 2 Views Left  Result Date: 05/18/2019 CLINICAL DATA:  Post ORIF left femur fracture. EXAM: LEFT FEMUR 2 VIEWS COMPARISON:  Preoperative radiographs yesterday. FINDINGS: Intramedullary nail with distal locking and trans trochanteric screw fixation of comminuted proximal femur  fracture. Persistent displacement of the lesser trochanter. Overall improved fracture alignment compared to preoperative imaging. No new fracture or periprosthetic lucency. Recent postsurgical change in the soft tissues occludes air and edema. IMPRESSION: Post ORIF of comminuted proximal femur fracture. No immediate postoperative complication. Electronically Signed   By: Narda Rutherford M.D.   On: 05/18/2019 20:31    Scheduled Meds: . cholecalciferol  2,000 Units Oral BID  . docusate sodium  100 mg Oral BID  . feeding supplement (ENSURE ENLIVE)  237 mL Oral Q1500  . folic acid  1 mg Oral Daily  . levothyroxine  50 mcg Oral Q0600  . polyethylene glycol  17 g Oral Daily  . propranolol  40 mg Oral BID  . vitamin C  500 mg Oral Daily   Continuous Infusions: . sodium chloride 50 mL/hr at 05/19/19 1121  . lactated ringers 10 mL/hr at 05/18/19 1241     LOS: 3 days   Time spent: 26 minutes   Hughie Closs, MD [facility] Pager [phone]  If 7PM-7AM, please contact night-coverage [email or web address] Password [email or web address] 05/20/2019, 2:06 PM

## 2019-05-20 NOTE — Op Note (Signed)
05/18/2019  7:06 PM  PATIENT:  Virginia Wright  85 y.o. female  PREOPERATIVE DIAGNOSIS: LEFT FOUR PART INTERTROCHANTERIC HIP FRACTURE  POSTOPERATIVE DIAGNOSIS: LEFT FOUR PART INTERTROCHANTERIC HIP FRACTURE  PROCEDURE:  Intramedullary nailing of the right hip using a Biomet Affixus nail 11 X 360 mm statically locked  SURGEON:  Astrid Divine. Marcelino Scot, M.D.  ASSISTANT:  none.  ANESTHESIA:  General.  COMPLICATIONS:  None.  ESTIMATED BLOOD LOSS:  200 cc.  DISPOSITION:  To PACU.  CONDITION:  Stable.  BRIEF SUMMARY AND INDICATION OF PROCEDURE:  Virginia Wright is a 85 y.o. year- old with multiple medical problems.  I discussed with the patient and family risks and benefits of surgical treatment including the potential for malunion, nonunion, symptomatic hardware, heart attack, stroke, neurovascular injury, bleeding, and others.  After full discussion, the patient and family wished to proceed.  BRIEF SUMMARY OF PROCEDURE:  The patient was taken to the operating room where general anesthesia was induced.  She was positioned supine on the Hana fracture table.  A closed reduction maneuver was performed of the fractured proximal femur and this was confirmed on both AP and lateral xray views. The fracture was in four parts and highly unstable with a free greater troch segment. A thorough scrub and wash with chlorhexidine and then Betadine scrub and paint was performed.  After sterile drapes and time-out, a long instrument was used to identify the appropriate starting position under C-arm on both AP and lateral images.  A 3 cm incision was made proximal to the greater trochanter.  The curved cannulated awl was inserted just medial to the tip of the lateral trochanter and then the starting guidewire advanced into the proximal femur.  This was checked on AP and lateral views.  The starting reamer was engaged with the soft tissue protected by a sleeve.  The curved ball-tipped guidewire was then  inserted, making sure it was just posterior as possible in the distal femur and across the fracture site, which stayed in a reduced position.  It was sequentially reamed up to 12.5 and an 11 x 360 mm nail inserted to the appropriate depth.  The guidewire for the lag screw was then inserted with the appropriate anteversion to make sure it was in a center-center position. Transient placement of a guidewire was also used to prevent rotation of the head and neck segment. This was measured and the lag screw placed with excellent purchase and position checked on both views.  The antirotation screw was then engaged within the groove of the lag screw, which was allowed to telescope.  Traction was released and compression achieved with the screw.  This was followed by placement of two distal locking screws using perfect circle technique.  This was confirmed on AP and lateral images. Wounds were irrigated thoroughly, closed in a standard layered fashion. Sterile gently compressive dressings were applied. The patient was awakened from anesthesia and transported to the PACU in stable condition.  PROGNOSIS:  The patient will be weightbearing as tolerated with physical therapy beginning DVT prophylaxis as soon as deemed stable by the Primary Care Service.  She has no range of motion precautions.  We will continue to follow through at the hospital.  Anticipate follow up in the office in 2 weeks for removal of sutures and further evaluation.     Astrid Divine. Marcelino Scot, M.D.

## 2019-05-20 NOTE — Progress Notes (Signed)
Orthopedic Trauma Service Progress Note  Patient ID: Virginia Wright MRN: [id number] DOB/AGE: 09-20-1932 85 y.o.  Subjective:  Doing fair Moderate L hip pain today  Has received 1 unit of PRBCs today, 2nd unit about to be hung per nursing  Spoke with son, plan for SNF at this time   No BM in about 4 days   + fatigue  + weak No CP or SOB   BUN and Cr improving   ROS As above  Objective:   VITALS:   Vitals:   05/20/19 0838 05/20/19 1038 05/20/19 1150 05/20/19 1153  BP: (!) 125/49 130/66 (!) 127/47 (!) 127/47  Pulse: 64 68 65 66  Resp: 18  16   Temp: 98.2 F (36.8 C)  99.3 F (37.4 C) 99.3 F (37.4 C)  TempSrc: Oral  Oral Oral  SpO2: 99%  95% 96%  Weight:      Height:        Estimated body mass index is 27.06 kg/m as calculated from the following:   Height as of this encounter: 5\' 2"  (1.575 m).   Weight as of this encounter: 67.1 kg.   Intake/Output      09/23 0701 - 09/24 0700 09/24 0701 - 09/25 0700   P.O. 840    I.V. (mL/kg) 272.2 (4.1) 20 (0.3)   IV Piggyback     Total Intake(mL/kg) 1112.2 (16.6) 20 (0.3)   Urine (mL/kg/hr)     Blood     Total Output     Net +1112.2 +20        Urine Occurrence 3 x      LABS  Results for orders placed or performed during the hospital encounter of 05/17/19 (from the past 24 hour(s))  CBC with Differential/Platelet     Status: Abnormal   Collection Time: 05/19/19  3:33 PM  Result Value Ref Range   WBC 9.3 4.0 - 10.5 K/uL   RBC 1.89 (L) 3.87 - 5.11 MIL/uL   Hemoglobin 7.3 (L) 12.0 - 15.0 g/dL   HCT 20.8 (L) 36.0 - 46.0 %   MCV 110.1 (H) 80.0 - 100.0 fL   MCH 38.6 (H) 26.0 - 34.0 pg   MCHC 35.1 30.0 - 36.0 g/dL   RDW 15.2 11.5 - 15.5 %   Platelets 92 (L) 150 - 400 K/uL   nRBC 0.5 (H) 0.0 - 0.2 %   Neutrophils Relative % 81 %   Neutro Abs 7.5 1.7 - 7.7 K/uL   Lymphocytes Relative 8 %   Lymphs Abs 0.8 0.7 - 4.0 K/uL   Monocytes  Relative 10 %   Monocytes Absolute 0.9 0.1 - 1.0 K/uL   Eosinophils Relative 0 %   Eosinophils Absolute 0.0 0.0 - 0.5 K/uL   Basophils Relative 0 %   Basophils Absolute 0.0 0.0 - 0.1 K/uL   Immature Granulocytes 1 %   Abs Immature Granulocytes 0.06 0.00 - 0.07 K/uL   Polychromasia PRESENT   CBC     Status: Abnormal   Collection Time: 05/20/19  3:08 AM  Result Value Ref Range   WBC 5.8 4.0 - 10.5 K/uL   RBC 1.63 (L) 3.87 - 5.11 MIL/uL   Hemoglobin 6.0 (LL) 12.0 - 15.0 g/dL   HCT 18.1 (L) 36.0 - 46.0 %   MCV 111.0 (H) 80.0 - 100.0  fL   MCH 36.8 (H) 26.0 - 34.0 pg   MCHC 33.1 30.0 - 36.0 g/dL   RDW 12.2 44.9 - 75.3 %   Platelets 73 (L) 150 - 400 K/uL   nRBC 1.2 (H) 0.0 - 0.2 %  Basic metabolic panel     Status: Abnormal   Collection Time: 05/20/19  3:08 AM  Result Value Ref Range   Sodium 138 135 - 145 mmol/L   Potassium 4.6 3.5 - 5.1 mmol/L   Chloride 107 98 - 111 mmol/L   CO2 23 22 - 32 mmol/L   Glucose, Bld 122 (H) 70 - 99 mg/dL   BUN 25 (H) 8 - 23 mg/dL   Creatinine, Ser 0.05 (H) 0.44 - 1.00 mg/dL   Calcium 7.9 (L) 8.9 - 10.3 mg/dL   GFR calc non Af Amer 46 (L) >60 mL/min   GFR calc Af Amer 53 (L) >60 mL/min   Anion gap 8 5 - 15  Type and screen [facility]     Status: None (Preliminary result)   Collection Time: 05/20/19  5:01 AM  Result Value Ref Range   ABO/RH(D) A NEG    Antibody Screen NEG    Sample Expiration 05/23/2019,2359    Unit Number [id number]    Blood Component Type RED CELLS,LR    Unit division 00    Status of Unit ISSUED    Transfusion Status OK TO TRANSFUSE    Crossmatch Result      Compatible Performed at [facility], [address] [place], [place] [address]    Unit Number [id number]    Blood Component Type RED CELLS,LR    Unit division 00    Status of Unit ALLOCATED    Transfusion Status OK TO TRANSFUSE    Crossmatch Result Compatible   Prepare RBC     Status: None   Collection Time: 05/20/19  5:01 AM   Result Value Ref Range   Order Confirmation      ORDER PROCESSED BY BLOOD BANK Performed at [facility], [address], [place], [place] [address]   ABO/Rh     Status: None   Collection Time: 05/20/19  5:01 AM  Result Value Ref Range   ABO/RH(D)      A NEG Performed at [facility], [address], [place], [place] [address]     Results for Virginia, Wright (MRN [id number]) as of 05/20/2019 13:22  Ref. Range 05/19/2019 01:03  Vitamin D, 25-Hydroxy Latest Ref Range: 30.0 - 100.0 ng/mL 21.8 (L)   PHYSICAL EXAM:   Gen: sitting up on toilet chair, NAD  Ext:       Left Lower Extremity   No change in exam    Assessment/Plan: 2 Days Post-Op   Principal Problem:   Hip fracture (HCC) Active Problems:   Thrombocytopenia (HCC)   Elevated serum creatinine   Essential hypertension   Anti-infectives (From admission, onward)   Start     Dose/Rate Route Frequency Ordered Stop   05/18/19 2000  clindamycin (CLEOCIN) IVPB 600 mg     600 mg 100 mL/hr over 30 Minutes Intravenous Every 6 hours 05/18/19 1927 05/19/19 0700   05/18/19 1400  clindamycin (CLEOCIN) IVPB 900 mg     900 mg 100 mL/hr over 30 Minutes Intravenous To Surgery 05/18/19 1357 05/18/19 1420    .  POD/HD#: 2  85 y/o female s/p fall with L hip fracture    - fall    -  L hip fracture s/p IMN              WBAT with assistance             ROM as tolerated L hip and knee             Ice prn              Dressing change tomorrow             PT/OT                         Likely SNF     - Pain management:             Scheduled tylenol             Norco for severe pain    - ABL anemia/Hemodynamics             getting 2 units prbcs today   H/H this PM   CBC in am   May need additional product                            Elevated kidney function improving                          - Medical issues              Per primary    - DVT/PE prophylaxis:             SCDs             Hold on lovenox as  platelets are <100k   - ID:              periop abx completed    - Metabolic Bone Disease:             + vitamin d insufficiency    Supplement               Fracture mechanism indicative of osteoporosis              DEXA in 4-8 weeks   - Activity:             WBAT              Up with assistance    - FEN/GI prophylaxis/Foley/Lines:             Reg diet diet    - Impediments to fracture healing:             Suspected osteoporosis    Vitamin d insufficiency  - Dispo:             Therapy              SNF likely   Mearl Latin, PA-C [phone] (C) 05/20/2019, 12:16 PM  [facility] [address] [place] [address] [phone] (F)

## 2019-05-21 DIAGNOSIS — I10 Essential (primary) hypertension: Secondary | ICD-10-CM

## 2019-05-21 DIAGNOSIS — D696 Thrombocytopenia, unspecified: Secondary | ICD-10-CM

## 2019-05-21 LAB — TYPE AND SCREEN
ABO/RH(D): A NEG
Antibody Screen: NEGATIVE
Unit division: 0
Unit division: 0

## 2019-05-21 LAB — BPAM RBC
Blood Product Expiration Date: 202010122359
Blood Product Expiration Date: 202010142359
ISSUE DATE / TIME: 202009240752
ISSUE DATE / TIME: 202009241314
Unit Type and Rh: 600
Unit Type and Rh: 600

## 2019-05-21 LAB — BASIC METABOLIC PANEL
Anion gap: 8 (ref 5–15)
BUN: 13 mg/dL (ref 8–23)
CO2: 24 mmol/L (ref 22–32)
Calcium: 8.3 mg/dL — ABNORMAL LOW (ref 8.9–10.3)
Chloride: 104 mmol/L (ref 98–111)
Creatinine, Ser: 0.86 mg/dL (ref 0.44–1.00)
GFR calc Af Amer: >60 mL/min (ref >60)
GFR calc non Af Amer: >60 mL/min (ref >60)
Glucose, Bld: 123 mg/dL — ABNORMAL HIGH (ref 70–99)
Potassium: 4.8 mmol/L (ref 3.5–5.1)
Sodium: 136 mmol/L (ref 135–145)

## 2019-05-21 MED ORDER — ACETAMINOPHEN 325 MG PO TABS
650.0000 mg | ORAL_TABLET | Freq: Two times a day (BID) | ORAL | Status: DC
  Administered 2019-05-21 – 2019-05-22 (×3): 650 mg via ORAL
  Filled 2019-05-21 (×3): qty 2

## 2019-05-21 NOTE — Progress Notes (Signed)
PROGRESS NOTE    Virginia Wright  HYQ:[id number] DOB: 01-Sep-1932 DOA: 05/17/2019 PCP: Chilton Greathouse, NP   Brief Narrative: Per prior attending: 85 y.o.femalewith medical history significant ofrheumatoid arthritis, chronic thrombocytopenia, hypertension, hypothyroidism presented to the hospital via EMS for evaluation of left hip and thigh pain after a fall.Patient states that she was in her usual state of health and fell while doing gardening in her yard.upon arrival to ER, her vital signs were stable. No leukocytosis. X-ray of left hip showed comminuted and displaced left proximal femur fracture involving the lesser trochanter and subtrochanteric femur and she was subsequently admitted to hospital service and orthopedics consulted and she underwent IMN on 05/18/2019. 9/24-patient had drop of hemoglobin to 2 unitss PRBC was transfused with improvement  Subjective: Seen this morning son at the bedside. Pain relatively stable but has pain when she moves. hemoglobin has stabilized.  Assessment & Plan:   Left proximal femur fracture, status post IM nailing- postop day #4.  Postop course complicated by acute blood loss anemia and is stabilized with 2 units transfusion.  Patient remains very deconditioned and frail, discussed with orthopedic surgery did not advise discharge today.  DVT prophylaxis with SCD and Lovenox on hold due to thrombocytopenia.  Continue pain control with IV morphine and oral hydrocodone, continues to need both.  Acute blood loss anemia hemoglobin dropped from 13.1-6.0.  And with 2 units improved appropriately. Monitor closely. Recent Labs  Lab 05/19/19 0103 05/19/19 1533 05/20/19 0308 05/20/19 1830 05/21/19 0351  HGB 8.3* 7.3* 6.0* 10.5* 9.9*  HCT 24.8* 20.8* 18.1* 29.8* 29.6*   Thrombocytopenia: Appears to be chronic ranging from 70,000 to low 100,000.  Per son patient has seen hematology in 2018 and Dr. was not concerned apparently.  Monitor closely.   Uptrending slowly. Recent Labs  Lab 05/18/19 2029 05/19/19 0103 05/19/19 1533 05/20/19 0308 05/21/19 0351  PLT 38* 85* 92* 73* 64*   Mild AKI: Creatinine improved.  Stop IV fluids and encourage oral hydration. Recent Labs  Lab 05/17/19 2101 05/18/19 0403 05/19/19 0103 05/20/19 0308 05/21/19 0650  BUN 27* 25* 26* 25* 13  CREATININE 1.23* 1.12* 1.57* 1.11* 0.86   Essential hypertension: Blood pressure fairly stable.  Amlodipine on hold Hypothyroidism on Synthroid.  Constipation continue laxatives and MiraLAX.  Nutrition: Nutrition Problem: Increased nutrient needs Etiology: post-op healing Signs/Symptoms: estimated needs Interventions: Ensure Enlive (each supplement provides 350kcal and 20 grams of protein)  Pressure Ulcer:  Body mass index is 27.06 kg/m.    DVT prophylaxis: SCD Code Status: Full code Family Communication: plan of care discussed with patient in detail.  Son at bedside updated  Disposition Plan: Remains inpatiendischarge to skilled nurse for once okay with Ortho- currently monitoring cbc, plt and if plat above 75k ortho planning on lovenox otherwise may need alternative regimen. Will need more PT seesions per ortho prior to d/c. Will order COVID early morning tomorrow.  Consultants: Orthopedics  Procedures: Left femur IM nailing  Microbiology: None  Antimicrobials: Anti-infectives (From admission, onward)   Start     Dose/Rate Route Frequency Ordered Stop   05/18/19 2000  clindamycin (CLEOCIN) IVPB 600 mg     600 mg 100 mL/hr over 30 Minutes Intravenous Every 6 hours 05/18/19 1927 05/19/19 0700   05/18/19 1400  clindamycin (CLEOCIN) IVPB 900 mg     900 mg 100 mL/hr over 30 Minutes Intravenous To Surgery 05/18/19 1357 05/18/19 1420       Objective: Vitals:   05/20/19 1326 05/20/19 1944 05/21/19  0419 05/21/19 0806  BP: (!) 115/48 (!) 129/44 (!) 136/51 (!) 137/52  Pulse: 74 69 66 61  Resp: 18 17 18 16   Temp: 97.8 F (36.6 C) 97.9 F  (36.6 C) 98.6 F (37 C) 98.8 F (37.1 C)  TempSrc: Oral Oral Oral Oral  SpO2: 96% 97% 96% 99%  Weight:      Height:        Intake/Output Summary (Last 24 hours) at 05/21/2019 1227 Last data filed at 05/21/2019 0759 Gross per 24 hour  Intake 2803.65 ml  Output -  Net 2803.65 ml   Filed Weights   05/18/19 1355 05/18/19 1930  Weight: 68 kg 67.1 kg   Weight change:   Body mass index is 27.06 kg/m.  Intake/Output from previous day: 09/24 0701 - 09/25 0700 In: 3064.9 [P.O.:360; I.V.:1107.8; Blood:927.1] Out: -  Intake/Output this shift: Total I/O In: 240 [P.O.:240] Out: -   Examination:  General exam: Appears calm and comfortable, elderly, frail deconditioned HEENT:PERRL,Oral mucosa moist, Ear/Nose normal on gross exam Respiratory system: Bilateral equal air entry, normal vesicular breath sounds, no wheezes or crackles  Cardiovascular system: S1 & S2 heard,No JVD, murmurs. Gastrointestinal system: Abdomen is  soft, non tender, non distended, BS +  Nervous System:Alert and oriented. No focal neurological deficits/moving extremities, sensation intact. Extremities: Tender left femur area with surgical site clean dry intact with dressing intact no edema, no clubbing, distal peripheral pulses palpable. Skin: No rashes, lesions, no icterus MSK: Normal muscle bulk,tone ,power  Medications:  Scheduled Meds: . acetaminophen  650 mg Oral Q12H  . cholecalciferol  2,000 Units Oral BID  . docusate sodium  100 mg Oral BID  . feeding supplement (ENSURE ENLIVE)  237 mL Oral Q1500  . folic acid  1 mg Oral Daily  . levothyroxine  50 mcg Oral Q0600  . polyethylene glycol  17 g Oral Daily  . propranolol  40 mg Oral BID  . vitamin C  500 mg Oral Daily   Continuous Infusions: . sodium chloride Stopped (05/20/19 1908)  . lactated ringers 10 mL/hr at 05/18/19 1241    Data Reviewed: I have personally reviewed following labs and imaging studies  CBC: Recent Labs  Lab 05/17/19 2101  05/18/19 2029 05/19/19 0103 05/19/19 1533 05/20/19 0308 05/20/19 1830 05/21/19 0351  WBC 6.3 8.8 8.3 9.3 5.8  --  4.6  NEUTROABS 4.9  --   --  7.5  --   --   --   HGB 13.1 9.3* 8.3* 7.3* 6.0* 10.5* 9.9*  HCT 37.2 27.3* 24.8* 20.8* 18.1* 29.8* 29.6*  MCV 106.9* 108.8* 110.2* 110.1* 111.0*  --  100.7*  PLT 104* 38* 85* 92* 73*  --  64*   Basic Metabolic Panel: Recent Labs  Lab 05/17/19 2101 05/18/19 0403 05/19/19 0103 05/20/19 0308 05/21/19 0650  NA 138 139 137 138 136  K 5.2* 4.8 5.2* 4.6 4.8  CL 106 106 104 107 104  CO2 22 26 24 23 24   GLUCOSE 158* 156* 210* 122* 123*  BUN 27* 25* 26* 25* 13  CREATININE 1.23* 1.12* 1.57* 1.11* 0.86  CALCIUM 9.0 8.5* 8.1* 7.9* 8.3*   GFR: Estimated Creatinine Clearance: 43.7 mL/min (by C-G formula based on SCr of 0.86 mg/dL). Liver Function Tests: Recent Labs  Lab 05/17/19 2101 05/18/19 0403  AST 19  --   ALT 19  --   ALKPHOS 49  --   BILITOT 1.4* 0.9  PROT 6.4*  --   ALBUMIN 4.1  --  No results for input(s): LIPASE, AMYLASE in the last 168 hours. No results for input(s): AMMONIA in the last 168 hours. Coagulation Profile: Recent Labs  Lab 05/17/19 2101  INR 1.0   Cardiac Enzymes: No results for input(s): CKTOTAL, CKMB, CKMBINDEX, TROPONINI in the last 168 hours. BNP (last 3 results) No results for input(s): PROBNP in the last 8760 hours. HbA1C: No results for input(s): HGBA1C in the last 72 hours. CBG: No results for input(s): GLUCAP in the last 168 hours. Lipid Profile: No results for input(s): CHOL, HDL, LDLCALC, TRIG, CHOLHDL, LDLDIRECT in the last 72 hours. Thyroid Function Tests: No results for input(s): TSH, T4TOTAL, FREET4, T3FREE, THYROIDAB in the last 72 hours. Anemia Panel: No results for input(s): VITAMINB12, FOLATE, FERRITIN, TIBC, IRON, RETICCTPCT in the last 72 hours. Sepsis Labs: No results for input(s): PROCALCITON, LATICACIDVEN in the last 168 hours.  Recent Results (from the past 240 hour(s))   SARS CORONAVIRUS 2 (TAT 6-24 HRS) Nasopharyngeal Nasopharyngeal Swab     Status: None   Collection Time: 05/17/19 10:10 PM   Specimen: Nasopharyngeal Swab  Result Value Ref Range Status   SARS Coronavirus 2 NEGATIVE NEGATIVE Final    Comment: (NOTE) SARS-CoV-2 target nucleic acids are NOT DETECTED. The SARS-CoV-2 RNA is generally detectable in upper and lower respiratory specimens during the acute phase of infection. Negative results do not preclude SARS-CoV-2 infection, do not rule out co-infections with other pathogens, and should not be used as the sole basis for treatment or other patient management decisions. Negative results must be combined with clinical observations, patient history, and epidemiological information. The expected result is Negative. Fact Sheet for Patients: HairSlick.nohttps://www.fda.gov/media/138098/download Fact Sheet for Healthcare Providers: quierodirigir.comhttps://www.fda.gov/media/138095/download This test is not yet approved or cleared by the [place] FDA and  has been authorized for detection and/or diagnosis of SARS-CoV-2 by FDA under an Emergency Use Authorization (EUA). This EUA will remain  in effect (meaning this test can be used) for the duration of the COVID-19 declaration under Section 56 4(b)(1) of the Act, 21 U.S.C. section 360bbb-3(b)(1), unless the authorization is terminated or revoked sooner. Performed at [facility], [address] [place], [place] [address]   SARS Coronavirus 2 [facility](Hospital order, Performed in [facility] hospital lab) Nasopharyngeal Nasopharyngeal Swab     Status: None   Collection Time: 05/18/19 10:00 AM   Specimen: Nasopharyngeal Swab  Result Value Ref Range Status   SARS Coronavirus 2 NEGATIVE NEGATIVE Final    Comment: (NOTE) If result is NEGATIVE SARS-CoV-2 target nucleic acids are NOT DETECTED. The SARS-CoV-2 RNA is generally detectable in upper and lower  respiratory specimens during the acute phase of infection. The  lowest  concentration of SARS-CoV-2 viral copies this assay can detect is 250  copies / mL. A negative result does not preclude SARS-CoV-2 infection  and should not be used as the sole basis for treatment or other  patient management decisions.  A negative result may occur with  improper specimen collection / handling, submission of specimen other  than nasopharyngeal swab, presence of viral mutation(s) within the  areas targeted by this assay, and inadequate number of viral copies  (<250 copies / mL). A negative result must be combined with clinical  observations, patient history, and epidemiological information. If result is POSITIVE SARS-CoV-2 target nucleic acids are DETECTED. The SARS-CoV-2 RNA is generally detectable in upper and lower  respiratory specimens dur ing the acute phase of infection.  Positive  results are indicative of active infection  with SARS-CoV-2.  Clinical  correlation with patient history and other diagnostic information is  necessary to determine patient infection status.  Positive results do  not rule out bacterial infection or co-infection with other viruses. If result is PRESUMPTIVE POSTIVE SARS-CoV-2 nucleic acids MAY BE PRESENT.   A presumptive positive result was obtained on the submitted specimen  and confirmed on repeat testing.  While 2019 novel coronavirus  (SARS-CoV-2) nucleic acids may be present in the submitted sample  additional confirmatory testing may be necessary for epidemiological  and / or clinical management purposes  to differentiate between  SARS-CoV-2 and other Sarbecovirus currently known to infect humans.  If clinically indicated additional testing with an alternate test  methodology [phone]) is advised. The SARS-CoV-2 RNA is generally  detectable in upper and lower respiratory sp ecimens during the acute  phase of infection. The expected result is Negative. Fact Sheet for Patients:  BoilerBrush.com.cy  Fact Sheet for Healthcare Providers: https://pope.com/ This test is not yet approved or cleared by the [place] FDA and has been authorized for detection and/or diagnosis of SARS-CoV-2 by FDA under an Emergency Use Authorization (EUA).  This EUA will remain in effect (meaning this test can be used) for the duration of the COVID-19 declaration under Section 564(b)(1) of the Act, 21 U.S.C. section 360bbb-3(b)(1), unless the authorization is terminated or revoked sooner. Performed at [facility], [address] [place], [place] [address]       Radiology Studies: No results found.    LOS: 4 days   Time spent: More than 50% of that time was spent in counseling and/or coordination of care.  Lanae Boast, MD [facility]  05/21/2019, 12:27 PM

## 2019-05-21 NOTE — Care Management (Addendum)
CM spoke to [employer], [employer]) for [facility]. Josem Kaufmann will be initiated. No COVID test needed if Josem Kaufmann is received and patient discharge to the facility today. CM team will continue to follow.   Addendum: 05/21/19 @ 1125-Jahson Emanuele RNCM-Spoke to [employer] ([employer] insurance Josem Kaufmann has been received, with a bed available for today or tomorrow-if medically stable. Patient will need a COVID test if she does not transition to the facility today.   Please call report to: [phone].  Midge Minium RN, BSN, NCM-BC, ACM-RN [phone]

## 2019-05-21 NOTE — Progress Notes (Addendum)
Orthopedic Trauma Service Progress Note  Patient ID: Virginia Wright MRN: [id number] DOB/AGE: 83/20/1934 83 y.o.  Subjective:  Doing ok  Felt better after second unit of PRBCs yesterday  L hip sore  Will likely need snf   Renal function improved   ROS As above  Objective:   VITALS:   Vitals:   05/20/19 1326 05/20/19 1944 05/21/19 0419 05/21/19 0806  BP: (!) 115/48 (!) 129/44 (!) 136/51 (!) 137/52  Pulse: 74 69 66 61  Resp: 18 17 18 16   Temp: 97.8 F (36.6 C) 97.9 F (36.6 C) 98.6 F (37 C) 98.8 F (37.1 C)  TempSrc: Oral Oral Oral Oral  SpO2: 96% 97% 96% 99%  Weight:      Height:        Estimated body mass index is 27.06 kg/m as calculated from the following:   Height as of this encounter: 5\' 2"  (1.575 m).   Weight as of this encounter: 67.1 kg.   Intake/Output      09/24 0701 - 09/25 0700 09/25 0701 - 09/26 0700   P.O. 360 240   I.V. (mL/kg) 1107.8 (16.5)    Blood 927.1    Other 670    Total Intake(mL/kg) 3064.9 (45.7) 240 (3.6)   Net +3064.9 +240        Urine Occurrence 5 x 1 x   Stool Occurrence 1 x      LABS  Results for orders placed or performed during the hospital encounter of 05/17/19 (from the past 24 hour(s))  Hemoglobin and hematocrit, blood     Status: Abnormal   Collection Time: 05/20/19  6:30 PM  Result Value Ref Range   Hemoglobin 10.5 (L) 12.0 - 15.0 g/dL   HCT 40.929.8 (L) 81.136.0 - 91.446.0 %  CBC     Status: Abnormal   Collection Time: 05/21/19  3:51 AM  Result Value Ref Range   WBC 4.6 4.0 - 10.5 K/uL   RBC 2.94 (L) 3.87 - 5.11 MIL/uL   Hemoglobin 9.9 (L) 12.0 - 15.0 g/dL   HCT 78.229.6 (L) 95.636.0 - 21.346.0 %   MCV 100.7 (H) 80.0 - 100.0 fL   MCH 33.7 26.0 - 34.0 pg   MCHC 33.4 30.0 - 36.0 g/dL   RDW 08.620.0 (H) 57.811.5 - 46.915.5 %   Platelets 64 (L) 150 - 400 K/uL   nRBC 1.3 (H) 0.0 - 0.2 %  Basic metabolic panel     Status: Abnormal   Collection Time: 05/21/19  6:50 AM   Result Value Ref Range   Sodium 136 135 - 145 mmol/L   Potassium 4.8 3.5 - 5.1 mmol/L   Chloride 104 98 - 111 mmol/L   CO2 24 22 - 32 mmol/L   Glucose, Bld 123 (H) 70 - 99 mg/dL   BUN 13 8 - 23 mg/dL   Creatinine, Ser 6.290.86 0.44 - 1.00 mg/dL   Calcium 8.3 (L) 8.9 - 10.3 mg/dL   GFR calc non Af Amer >60 >60 mL/min   GFR calc Af Amer >60 >60 mL/min   Anion gap 8 5 - 15     PHYSICAL EXAM:   Gen: resting comfortably in bed, NAD, appears well Lungs: unlabored Cardiac: regular  Ext:       Left Lower Extremity  Dressings stable    Scant drainage noted on dressings              Ext warm              + DP pulse             Distal motor and sensory functions intact             Swelling stable    Assessment/Plan: 3 Days Post-Op   Principal Problem:   Hip fracture (HCC) Active Problems:   Thrombocytopenia (HCC)   Elevated serum creatinine   Essential hypertension   Anti-infectives (From admission, onward)   Start     Dose/Rate Route Frequency Ordered Stop   05/18/19 2000  clindamycin (CLEOCIN) IVPB 600 mg     600 mg 100 mL/hr over 30 Minutes Intravenous Every 6 hours 05/18/19 1927 05/19/19 0700   05/18/19 1400  clindamycin (CLEOCIN) IVPB 900 mg     900 mg 100 mL/hr over 30 Minutes Intravenous To Surgery 05/18/19 1357 05/18/19 1420    .  POD/HD#: 4  85 y/o female s/p fall with L hip fracture    - fall    - L hip fracture s/p IMN              WBAT with assistance             ROM as tolerated L hip and knee             Ice prn              Dressing changes as needed    Ok to shower and clean wounds with soap and water only    No ointments, lotions or solutions              PT/OT                         Likely SNF     - Pain management:             Scheduled tylenol- 650 mg po q12h              Norco for severe pain    - ABL anemia/Hemodynamics             h/h improved  Renal function improved  Continue to monitor                           -  Medical issues              Per primary    - DVT/PE prophylaxis:             SCDs             Hold on lovenox as platelets are <100k   - ID:              periop abx completed    - Metabolic Bone Disease:             + vitamin d insufficiency                          Supplement   Calcitriol levels look good, do not recommend calcium supplementation at this time  Fracture mechanism indicative of osteoporosis              DEXA in 4-8 weeks   - Activity:             WBAT              Up with assistance    - FEN/GI prophylaxis/Foley/Lines:             Reg diet diet   RD consult     Nutritional supplementation to maximize healing    - Impediments to fracture healing:             Suspected osteoporosis               Vitamin d insufficiency  - Dispo:             Therapy              SNF likely   Do not believe pt stable for discharge at current time    Mearl Latin, PA-C [phone] (C) 05/21/2019, 9:46 AM  [facility] [address] [place] [address] [phone] (F)

## 2019-05-21 NOTE — Progress Notes (Signed)
Pt feeling weaker today. Per son, pt not eating or drinking well, IV fluids restarted about 2 hours ago. Got pt a beverage. Pt did not want to get to chair, but agreed to ask staff to help her OOB for supper.    05/21/19 1600  OT Visit Information  Last OT Received On 05/21/19  Assistance Needed +1  History of Present Illness Pt is an 85 year old woman admitted after falling while trimming her bushes resulting in L hip fx. Underwent IM nail 05/18/19. PMH: asthma, HTN, RA, hypothyroidism, tremor, thrombocytopenia.  Precautions  Precautions Fall  Pain Assessment  Pain Assessment Faces  Faces Pain Scale 4  Pain Location left leg  Pain Descriptors / Indicators Operative site guarding;Grimacing  Pain Intervention(s) Monitored during session;Repositioned  Cognition  Arousal/Alertness Awake/alert  Behavior During Therapy WFL for tasks assessed/performed  Overall Cognitive Status Within Functional Limits for tasks assessed  Bed Mobility  Overal bed mobility Needs Assistance  Bed Mobility Supine to Sit;Sit to Supine  Supine to sit Mod assist  Sit to supine Mod assist  General bed mobility comments assist for L LE to EOB and to raise trunk, assist for LEs back into bed  Balance  Overall balance assessment Needs assistance  Sitting-balance support Feet supported;No upper extremity supported  Sitting balance-Leahy Scale Fair  Sitting balance - Comments sat x 15 minutes at EOB while sipping a Coke  Restrictions  LLE Weight Bearing WBAT  Transfers  General transfer comment pt declined OOB, agreed to get up with staff for supper  OT - End of Session  Activity Tolerance Patient limited by fatigue  Patient left in bed;with call bell/phone within reach;with family/visitor present  OT Assessment/Plan  OT Plan Discharge plan remains appropriate  OT Visit Diagnosis Unsteadiness on feet (R26.81);Other abnormalities of gait and mobility (R26.89);Muscle weakness (generalized) (M62.81);Pain;History of  falling (Z91.81)  OT Frequency (ACUTE ONLY) Min 2X/week  Follow Up Recommendations SNF;Supervision/Assistance - 24 hour  OT Equipment 3 in 1 bedside commode  AM-PAC OT "6 Clicks" Daily Activity Outcome Measure (Version 2)  Help from another person eating meals? 4  Help from another person taking care of personal grooming? 3  Help from another person toileting, which includes using toliet, bedpan, or urinal? 2  Help from another person bathing (including washing, rinsing, drying)? 2  Help from another person to put on and taking off regular upper body clothing? 4  Help from another person to put on and taking off regular lower body clothing? 2  6 Click Score 17  OT Goal Progression  Progress towards OT goals Not progressing toward goals - comment (pt feeling weaker today)  Acute Rehab OT Goals  Patient Stated Goal to return to her independence, gardening  OT Goal Formulation With patient  Potential to Achieve Goals Good  OT Time Calculation  OT Start Time (ACUTE ONLY) 1411  OT Stop Time (ACUTE ONLY) 1430  OT Time Calculation (min) 19 min  OT General Charges  $OT Visit 1 Visit  OT Treatments  $Therapeutic Activity 8-22 mins  Nestor Lewandowsky, OTR/L Acute Rehabilitation Services Pager: [phone] Office: [phone]

## 2019-05-22 LAB — CBC
HCT: 29.6 % — ABNORMAL LOW (ref 36.0–46.0)
HCT: 31.1 % — ABNORMAL LOW (ref 36.0–46.0)
Hemoglobin: 9.9 g/dL — ABNORMAL LOW (ref 12.0–15.0)
Hemoglobin: 10.4 g/dL — ABNORMAL LOW (ref 12.0–15.0)
MCH: 33.7 pg (ref 26.0–34.0)
MCH: 33.5 pg (ref 26.0–34.0)
MCHC: 33.4 g/dL (ref 30.0–36.0)
MCHC: 33.4 g/dL (ref 30.0–36.0)
MCV: 100.7 fL — ABNORMAL HIGH (ref 80.0–100.0)
MCV: 100.3 fL — ABNORMAL HIGH (ref 80.0–100.0)
Platelets: 64 10*3/uL — ABNORMAL LOW (ref 150–400)
Platelets: 86 10*3/uL — ABNORMAL LOW (ref 150–400)
RBC: 2.94 MIL/uL — ABNORMAL LOW (ref 3.87–5.11)
RBC: 3.1 MIL/uL — ABNORMAL LOW (ref 3.87–5.11)
RDW: 20 % — ABNORMAL HIGH (ref 11.5–15.5)
RDW: 19 % — ABNORMAL HIGH (ref 11.5–15.5)
WBC: 4.6 10*3/uL (ref 4.0–10.5)
WBC: 4.4 10*3/uL (ref 4.0–10.5)
nRBC: 1.3 % — ABNORMAL HIGH (ref 0.0–0.2)
nRBC: 0.7 % — ABNORMAL HIGH (ref 0.0–0.2)

## 2019-05-22 LAB — TSH: TSH: 6.181 u[IU]/mL — ABNORMAL HIGH (ref 0.350–4.500)

## 2019-05-22 LAB — MAGNESIUM: Magnesium: 2 mg/dL (ref 1.7–2.4)

## 2019-05-22 LAB — PHOSPHORUS: Phosphorus: 3.8 mg/dL (ref 2.5–4.6)

## 2019-05-22 LAB — SARS CORONAVIRUS 2 (TAT 6-24 HRS): SARS Coronavirus 2: NEGATIVE

## 2019-05-22 LAB — PREALBUMIN: Prealbumin: 7.9 mg/dL — ABNORMAL LOW (ref 18–38)

## 2019-05-22 MED ORDER — ASCORBIC ACID 500 MG PO TABS: 500.0000 mg | ORAL_TABLET | Freq: Every day | ORAL | Status: AC

## 2019-05-22 MED ORDER — POLYETHYLENE GLYCOL 3350 17 G PO PACK: 17.0000 g | PACK | Freq: Every day | ORAL | 0 refills | Status: AC | PRN

## 2019-05-22 MED ORDER — DOCUSATE SODIUM 100 MG PO CAPS: 100.0000 mg | ORAL_CAPSULE | Freq: Two times a day (BID) | ORAL | 0 refills | Status: AC

## 2019-05-22 MED ORDER — ENOXAPARIN SODIUM 30 MG/0.3ML ~~LOC~~ SOLN: 30.0000 mg | SUBCUTANEOUS | Status: AC

## 2019-05-22 MED ORDER — AMLODIPINE BESYLATE 5 MG PO TABS: 5.0000 mg | ORAL_TABLET | Freq: Every day | ORAL | Status: AC

## 2019-05-22 MED ORDER — ENSURE ENLIVE PO LIQD: 237.0000 mL | Freq: Every day | ORAL | 12 refills | Status: AC

## 2019-05-22 MED ORDER — ENOXAPARIN SODIUM 30 MG/0.3ML ~~LOC~~ SOLN
30.0000 mg | SUBCUTANEOUS | Status: DC
  Administered 2019-05-22: 30 mg via SUBCUTANEOUS
  Filled 2019-05-22: qty 0.3

## 2019-05-22 MED ORDER — HYDROCODONE-ACETAMINOPHEN 5-325 MG PO TABS: 1.0000 | ORAL_TABLET | Freq: Four times a day (QID) | ORAL | 0 refills | Status: AC | PRN

## 2019-05-22 MED ORDER — VITAMIN D3 25 MCG PO TABS: 2000.0000 [IU] | ORAL_TABLET | Freq: Two times a day (BID) | ORAL | Status: AC

## 2019-05-22 NOTE — TOC Transition Note (Signed)
Transition of Care [facility]) - CM/SW Discharge Note   Patient Details  Name: Virginia Wright MRN: [id number] Date of Birth: 1932/11/12  Transition of Care [facility]) CM/SW Contact:  Bary Castilla, LCSW Phone Number: 05/22/2019, 12:32 PM   Clinical Narrative:     CSW was alerted that patient was medically stable for discharge. CSW confirmed bed readiness at [facility] with [facility]. Patient has a bed ready.  Patient will DC to:?[facility] Anticipated DC date:?05/22/19 Family notified:?Son Transport UU:VOZD   Per MD patient ready for DC to [facility] [facility], patient, patient's family, and facility notified of DC. Discharge Summary sent to facility. RN given number for report  [phone] Room 604a. DC packet on chart. Ambulance transport requested for patient.   CSW signing off.   [place], [place] [phone]    Final next level of care: Skilled Nursing Facility Barriers to Discharge: No Barriers Identified   Patient Goals and CMS Choice Patient states their goals for this hospitalization and ongoing recovery are:: "to get better" CMS Medicare.gov Compare Post Acute Care list provided to:: Patient Represenative (must comment)(CMS SNF Compare list provided to patients son as requested) Choice offered to / list presented to : Adult Children(CMS SNF Compare list provided to patients son as requested)  Discharge Placement              [facility] bed at: [facility]) Patient to be transferred to facility by: [facility] Name of family member notified: son Patient and family notified of of transfer: 05/22/19  Discharge Plan and Services In-house Referral: NA Discharge Planning Services: CM Consult Post Acute Care Choice: [facility]          DME Arranged: N/A DME Agency: NA       HH Arranged: NA HH Agency: NA        Social Determinants of Health (SDOH) Interventions     Readmission Risk Interventions No flowsheet data found.

## 2019-05-22 NOTE — Progress Notes (Signed)
SPORTS MEDICINE AND JOINT REPLACEMENT  Georgena Spurling, MD    Laurier Nancy, PA-C [address] [place], [place], [place]  [address]                             [phone]   PROGRESS NOTE  Subjective:  negative for Chest Pain  negative for Shortness of Breath  negative for Nausea/Vomiting   negative for Calf Pain  negative for Bowel Movement   Tolerating Diet: yes         Patient reports pain as 3 on 0-10 scale.    Objective: Vital signs in last 24 hours:    Patient Vitals for the past 24 hrs:  BP Temp Temp src Pulse Resp SpO2  05/22/19 0514 (!) 142/45 98.1 F (36.7 C) Oral (!) 59 16 99 %  05/21/19 2004 (!) 141/48 98.5 F (36.9 C) Oral 63 16 98 %  05/21/19 1307 (!) 139/53 98.1 F (36.7 C) Oral (!) 59 16 100 %  05/21/19 0806 (!) 137/52 98.8 F (37.1 C) Oral 61 16 99 %    @flow {1959:LAST@   Intake/Output from previous day:   09/25 0701 - 09/26 0700 In: 720 [P.O.:720] Out: 350 [Urine:350]   Intake/Output this shift:   No intake/output data recorded.   Intake/Output      09/25 0701 - 09/26 0700 09/26 0701 - 09/27 0700   P.O. 720    I.V. (mL/kg)     Blood     Other     Total Intake(mL/kg) 720 (10.7)    Urine (mL/kg/hr) 350 (0.2)    Emesis/NG output 0    Stool 0    Total Output 350    Net +370         Urine Occurrence 3 x       LABORATORY DATA: Recent Labs    05/17/19 2101 05/18/19 2029 05/19/19 0103 05/19/19 1533 05/20/19 0308 05/20/19 1830 05/21/19 0351 05/22/19 0304  WBC 6.3 8.8 8.3 9.3 5.8  --  4.6 4.4  HGB 13.1 9.3* 8.3* 7.3* 6.0* 10.5* 9.9* 10.4*  HCT 37.2 27.3* 24.8* 20.8* 18.1* 29.8* 29.6* 31.1*  PLT 104* 38* 85* 92* 73*  --  64* 86*   Recent Labs    05/17/19 2101 05/18/19 0403 05/19/19 0103 05/20/19 0308 05/21/19 0650  NA 138 139 137 138 136  K 5.2* 4.8 5.2* 4.6 4.8  CL 106 106 104 107 104  CO2 22 26 24 23 24   BUN 27* 25* 26* 25* 13  CREATININE 1.23* 1.12* 1.57* 1.11* 0.86  GLUCOSE 158* 156* 210* 122* 123*  CALCIUM 9.0 8.5*  8.1* 7.9* 8.3*   Lab Results  Component Value Date   INR 1.0 05/17/2019    Examination:  General appearance: alert, cooperative and no distress Extremities: extremities normal, atraumatic, no cyanosis or edema  Wound Exam: clean, dry, intact   Drainage:  None: wound tissue dry  Motor Exam: Quadriceps and Hamstrings Intact  Sensory Exam: Superficial Peroneal, Deep Peroneal and Tibial normal   Assessment:    4 Days Post-Op  Procedure(s) (LRB): LEFT HIP FRACTURE FIXATION (Left)  ADDITIONAL DIAGNOSIS:  Principal Problem:   Hip fracture (HCC) Active Problems:   Thrombocytopenia (HCC)   Elevated serum creatinine   Essential hypertension     Plan: Physical Therapy as ordered Weight Bearing as Tolerated (WBAT)  Patient doing well, mild pain. Alert and oriented. Plan for D/C to SNF when bed available. Will continue to  follow through weekend. Medicine following   Donia Ast 05/22/2019, 7:01 AM

## 2019-05-22 NOTE — Plan of Care (Signed)
  Problem: Pain Management: Goal: Pain level will decrease Outcome: Progressing   Problem: Pain Managment: Goal: General experience of comfort will improve Outcome: Progressing   Problem: Safety: Goal: Ability to remain free from injury will improve Outcome: Progressing   Problem: Skin Integrity: Goal: Risk for impaired skin integrity will decrease Outcome: Progressing   

## 2019-05-22 NOTE — Discharge Summary (Signed)
Physician Discharge Summary  Kamyri Dautrich MWU:[id number] DOB: [birth year] DOA: 05/17/2019  PCP: Chilton Greathouse, NP  Admit date: 05/17/2019 Discharge date: 05/22/2019   Admitted From: home Disposition:  SNF  Recommendations for Outpatient Follow-up:  1. Follow up with PCP in 1-2 weeks 2. Please obtain BMP/CBC in one week 3. Please follow up on the following pending results:  Home Health: no Equipment/Devices: none  Discharge Condition: Stable CODE STATUS: FULL Diet recommendation: Heart Healthy  Brief/Interim Summary: 85 y.o.femalewith medical history significant ofrheumatoid arthritis, chronic thrombocytopenia, hypertension, hypothyroidism presented to the hospital via EMS for evaluation of left hip and thigh pain after a fall.Patient states that she was in her usual state of health and fell while doing gardening in her yard.upon arrival to ER, her vital signs were stable. No leukocytosis. X-ray of left hip showed comminuted and displaced left proximal femur fracture involving the lesser trochanter and subtrochanteric femur and she was subsequently admitted to hospital service and orthopedics consulted and she underwent IMN on 05/18/2019. 9/24-patient had drop of hemoglobin to 2 unitss PRBC was transfused with improvement  Subjective: Seen this morning son at the bedside.  hemoglobin is stable pain controlled  Assessment & Plan:   Left proximal femur fracture, status post IM nailing- postop day #5.  Postop course complicated by acute blood loss anemia and is stabilized with 2 units transfusion.  Patient  Seems to be doing well this morning.  Seen by Orthopedic surgery and discussed and okay for discharge to skilled nursing facility.  Orthopedic recommends to start Lovenox 30 mg-lower dose due to low platelet count but above 75k.  Cont oral hydrocodone, stool softener,  Calcium and vitamin supplementation and follow-up with orthopedic surgery as outpatient upon  discharge.  Acute blood loss anemia hemoglobin dropped from 13.1-6.0.    Status post 2 units improved appropriately. Recent Labs  Lab 05/19/19 1533 05/20/19 0308 05/20/19 1830 05/21/19 0351 05/22/19 0304  HGB 7.3* 6.0* 10.5* 9.9* 10.4*  HCT 20.8* 18.1* 29.8* 29.6* 31.1*   Thrombocytopenia: Appears to be chronic ranging from 70,000 to low 100,000.  Per son patient has seen hematology in 2018 and Dr. was not concerned apparently.  Monitor closely.  Uptrending  Recent Labs  Lab 05/19/19 0103 05/19/19 1533 05/20/19 0308 05/21/19 0351 05/22/19 0304  PLT 85* 92* 73* 64* 86*   Mild AKI: Creatinine improved.   Essential hypertension: Blood pressure fairly stable.  Amlodipine resuming at half of dose at 5 mg  Hypothyroidism on Synthroid.  Constipation continue laxatives and MiraLAX.  Nutrition: Nutrition Problem: Increased nutrient needs Etiology: post-op healing Signs/Symptoms: estimated needs Interventions: Ensure Enlive (each supplement provides 350kcal and 20 grams of protein)  Pressure Ulcer:  Body mass index is 27.06 kg/m.    DVT prophylaxis: SCD/Lovenox Code Status: Full code Family Communication: plan of care discussed with patient in detail.  Son was updated over the phone.  Disposition Plan:  PATIENT IS BEING DISCHARGED TO SKILLED NURSING FACILITY TODAY.  Consultants: Orthopedics  Procedures: Left femur IM nailing  Microbiology: None  Antimicrobials:none  Discharge Diagnoses:  Principal Problem:   Hip fracture (HCC) Active Problems:   Thrombocytopenia (HCC)   Elevated serum creatinine   Essential hypertension    Discharge Instructions  Discharge Instructions    Call MD for:  persistant nausea and vomiting   Complete by: As directed    Call MD for:  severe uncontrolled pain   Complete by: As directed    Call MD for:  temperature >100.4  Complete by: As directed    Diet - low sodium heart healthy   Complete by: As directed     Discharge instructions   Complete by: As directed    CHECK WEEKLY CBC for low platelet counts while on lovenox   Increase activity slowly   Complete by: As directed      Allergies as of 05/22/2019      Reactions   Celecoxib Other (See Comments)   Other reaction(s): SWELLING/EDEMA   Clarithromycin Other (See Comments)   Patient doesn't remember reaction   Cefdinir Rash   Codeine Nausea Only   Gatifloxacin Other (See Comments)   Patient doesn't remember reaction   Hydroxychloroquine Other (See Comments)   Patient doesn't remember reaction      Medication List    TAKE these medications   albuterol 108 (90 Base) MCG/ACT inhaler Commonly known as: VENTOLIN HFA Inhale 2 puffs into the lungs 4 (four) times daily as needed.   amLODipine 5 MG tablet Commonly known as: NORVASC Take 1 tablet (5 mg total) by mouth daily. Hold for sbp <110 What changed:   medication strength  how much to take  additional instructions   ascorbic acid 500 MG tablet Commonly known as: VITAMIN C Take 1 tablet (500 mg total) by mouth daily.   docusate sodium 100 MG capsule Commonly known as: COLACE Take 1 capsule (100 mg total) by mouth 2 (two) times daily.   enoxaparin 30 MG/0.3ML injection Commonly known as: LOVENOX Inject 0.3 mLs (30 mg total) into the skin daily. Hold if platelet counts are below 75k and discuss with doctor/orthopedic for alternative dvt prophylaxis   feeding supplement (ENSURE ENLIVE) Liqd Take 237 mLs by mouth daily at 3 pm.   folic acid 1 MG tablet Commonly known as: FOLVITE Take 1 tablet by mouth daily.   HYDROcodone-acetaminophen 5-325 MG tablet Commonly known as: NORCO/VICODIN Take 1 tablet by mouth every 6 (six) hours as needed for up to 10 doses for severe pain (pain score 4-6).   levothyroxine 50 MCG tablet Commonly known as: SYNTHROID Take 50 mcg by mouth daily before breakfast.   methotrexate 2.5 MG tablet Commonly known as: RHEUMATREX Take 10 mg by  mouth every Wednesday. Caution:Chemotherapy. Protect from light.   polyethylene glycol 17 g packet Commonly known as: MIRALAX / GLYCOLAX Take 17 g by mouth daily as needed.   propranolol 40 MG tablet Commonly known as: INDERAL TAKE ONE TABLET BY MOUTH TWICE DAILY   Vitamin D3 25 MCG tablet Commonly known as: Vitamin D Take 2 tablets (2,000 Units total) by mouth 2 (two) times daily.      Follow-up Information    Myrene GalasHandy, Michael, MD Follow up in 2 week(s).   Specialty: Orthopedic Surgery Contact information: [address] [place] [address] [phone]          Allergies  Allergen Reactions  . Celecoxib Other (See Comments)    Other reaction(s): SWELLING/EDEMA  . Clarithromycin Other (See Comments)    Patient doesn't remember reaction  . Cefdinir Rash  . Codeine Nausea Only  . Gatifloxacin Other (See Comments)    Patient doesn't remember reaction  . Hydroxychloroquine Other (See Comments)    Patient doesn't remember reaction    Consultations:  otrhopedic  Procedures/Studies: Dg Chest 1 View  Result Date: 05/17/2019 CLINICAL DATA:  Fall with left hip fracture. EXAM: CHEST  1 VIEW COMPARISON:  Report from radiograph 08/31/2018, images not available. FINDINGS: The cardiomediastinal contours are unchanged. Stable borderline cardiomegaly.  Pulmonary vasculature is normal. No consolidation, pleural effusion, or pneumothorax. No acute osseous abnormalities are seen. IMPRESSION: 1. No acute abnormality. 2. Borderline cardiomegaly. Electronically Signed   By: Narda RutherfordMelanie  Sanford M.D.   On: 05/17/2019 20:47   Dg Knee 1-2 Views Left  Result Date: 05/18/2019 CLINICAL DATA:  Left leg pain EXAM: LEFT KNEE - 1-2 VIEW COMPARISON:  None. FINDINGS: No evidence of fracture, dislocation, or joint effusion. No evidence of arthropathy or other focal bone abnormality. Soft tissues are unremarkable. IMPRESSION: Negative. Electronically Signed   By: Duanne GuessNicholas  Plundo M.D.   On: 05/18/2019  09:16   Pelvis Portable  Result Date: 05/18/2019 CLINICAL DATA:  Left hip fracture, fixation EXAM: PORTABLE PELVIS 1-2 VIEWS COMPARISON:  05/17/2019 FINDINGS: Internal fixation across the proximal left femoral shaft fracture. Continued mild displacement of fracture fragments. No hardware complicating feature. IMPRESSION: Internal fixation as above. Electronically Signed   By: Charlett NoseKevin  Dover M.D.   On: 05/18/2019 20:10   Dg C-arm 1-60 Min  Result Date: 05/18/2019 CLINICAL DATA:  Open reduction and internal fixation of left femoral fracture. EXAM: DG C-ARM 1-60 MIN CONTRAST:  None. FLUOROSCOPY TIME:  Fluoroscopy Time:  2 minutes 28 seconds. Number of Acquired Spot Images: 6. COMPARISON:  None. FINDINGS: Six intraoperative fluoroscopic images of the left femur demonstrate the patient be status post intramedullary rod fixation of proximal left femoral shaft fracture. Good alignment of fracture components is noted. IMPRESSION: Fluoroscopic guidance provided during open reduction and internal fixation of proximal left femoral fracture. Electronically Signed   By: Lupita RaiderJames  Green Jr M.D.   On: 05/18/2019 16:17   Dg Hip Operative Unilat W Or W/o Pelvis Left  Result Date: 05/18/2019 CLINICAL DATA:  ORIF left femur EXAM: OPERATIVE LEFT HIP (WITH PELVIS IF PERFORMED) 6 VIEWS TECHNIQUE: Fluoroscopic spot image(s) were submitted for interpretation post-operatively. COMPARISON:  05/17/2019. FINDINGS: Proximal left femoral fracture again noted. ORIF. Hardware intact. Near anatomic alignment. IMPRESSION: ORIF left femur. Electronically Signed   By: Maisie Fushomas  Register   On: 05/18/2019 16:18   Dg Hip Unilat W Or Wo Pelvis 2-3 Views Left  Result Date: 05/17/2019 CLINICAL DATA:  Left hip pain after mechanical fall today. EXAM: DG HIP (WITH OR WITHOUT PELVIS) 2-3V LEFT COMPARISON:  None. FINDINGS: Comminuted and displaced left proximal femur fracture involving the lesser trochanter and subtrochanteric femur. There is apex  lateral angulation and mild proximal migration of the femoral shaft. Femoral head remains seated. Pubic rami are intact. Degenerative change of the pubic symphysis and sacroiliac joints. IMPRESSION: Comminuted and displaced left proximal femur fracture involving the lesser trochanter and subtrochanteric femur. Electronically Signed   By: Narda RutherfordMelanie  Sanford M.D.   On: 05/17/2019 20:45   Dg Femur Min 2 Views Left  Result Date: 05/18/2019 CLINICAL DATA:  Post ORIF left femur fracture. EXAM: LEFT FEMUR 2 VIEWS COMPARISON:  Preoperative radiographs yesterday. FINDINGS: Intramedullary nail with distal locking and trans trochanteric screw fixation of comminuted proximal femur fracture. Persistent displacement of the lesser trochanter. Overall improved fracture alignment compared to preoperative imaging. No new fracture or periprosthetic lucency. Recent postsurgical change in the soft tissues occludes air and edema. IMPRESSION: Post ORIF of comminuted proximal femur fracture. No immediate postoperative complication. Electronically Signed   By: Narda RutherfordMelanie  Sanford M.D.   On: 05/18/2019 20:31   Discharge Exam: Vitals:   05/22/19 0514 05/22/19 0804  BP: (!) 142/45 (!) 143/51  Pulse: (!) 59 63  Resp: 16 16  Temp: 98.1 F (36.7 C) 99.3 F (37.4 C)  SpO2: 99% 98%   Vitals:   05/21/19 1307 05/21/19 2004 05/22/19 0514 05/22/19 0804  BP: (!) 139/53 (!) 141/48 (!) 142/45 (!) 143/51  Pulse: (!) 59 63 (!) 59 63  Resp: Temp: 98.1 F (36.7 C) 98.5 F (36.9 C) 98.1 F (36.7 C) 99.3 F (37.4 C)  TempSrc: Oral Oral Oral Oral  SpO2: 100% 98% 99% 98%  Weight:      Height:        General: Pt is alert, awake, not in acute distress Cardiovascular: RRR, S1/S2 +, no rubs, no gallops Respiratory: CTA bilaterally, no wheezing, no rhonchi Abdominal: Soft, NT, ND, bowel sounds + Extremities: no edema, no cyanosis.  Postop dressing surgical site is clean dry intact.  The results of significant diagnostics  from this hospitalization (including imaging, microbiology, ancillary and laboratory) are listed below for reference.     Microbiology: Recent Results (from the past 240 hour(s))  SARS CORONAVIRUS 2 (TAT 6-24 HRS) Nasopharyngeal Nasopharyngeal Swab     Status: None   Collection Time: 05/17/19 10:10 PM   Specimen: Nasopharyngeal Swab  Result Value Ref Range Status   SARS Coronavirus 2 NEGATIVE NEGATIVE Final    Comment: (NOTE) SARS-CoV-2 target nucleic acids are NOT DETECTED. The SARS-CoV-2 RNA is generally detectable in upper and lower respiratory specimens during the acute phase of infection. Negative results do not preclude SARS-CoV-2 infection, do not rule out co-infections with other pathogens, and should not be used as the sole basis for treatment or other patient management decisions. Negative results must be combined with clinical observations, patient history, and epidemiological information. The expected result is Negative. Fact Sheet for Patients: HairSlick.no Fact Sheet for Healthcare Providers: quierodirigir.com This test is not yet approved or cleared by the [place] FDA and  has been authorized for detection and/or diagnosis of SARS-CoV-2 by FDA under an Emergency Use Authorization (EUA). This EUA will remain  in effect (meaning this test can be used) for the duration of the COVID-19 declaration under Section 56 4(b)(1) of the Act, 21 U.S.C. section 360bbb-3(b)(1), unless the authorization is terminated or revoked sooner. Performed at [facility], [address] [place], [place] [address]   SARS Coronavirus 2 [facility] order, Performed in [facility] hospital lab) Nasopharyngeal Nasopharyngeal Swab     Status: None   Collection Time: 05/18/19 10:00 AM   Specimen: Nasopharyngeal Swab  Result Value Ref Range Status   SARS Coronavirus 2 NEGATIVE NEGATIVE Final    Comment: (NOTE) If result is  NEGATIVE SARS-CoV-2 target nucleic acids are NOT DETECTED. The SARS-CoV-2 RNA is generally detectable in upper and lower  respiratory specimens during the acute phase of infection. The lowest  concentration of SARS-CoV-2 viral copies this assay can detect is 250  copies / mL. A negative result does not preclude SARS-CoV-2 infection  and should not be used as the sole basis for treatment or other  patient management decisions.  A negative result may occur with  improper specimen collection / handling, submission of specimen other  than nasopharyngeal swab, presence of viral mutation(s) within the  areas targeted by this assay, and inadequate number of viral copies  (<250 copies / mL). A negative result must be combined with clinical  observations, patient history, and epidemiological information. If result is POSITIVE SARS-CoV-2 target nucleic acids are DETECTED. The SARS-CoV-2 RNA is generally detectable in upper and lower  respiratory specimens dur ing the acute phase of infection.  Positive  results  are indicative of active infection with SARS-CoV-2.  Clinical  correlation with patient history and other diagnostic information is  necessary to determine patient infection status.  Positive results do  not rule out bacterial infection or co-infection with other viruses. If result is PRESUMPTIVE POSTIVE SARS-CoV-2 nucleic acids MAY BE PRESENT.   A presumptive positive result was obtained on the submitted specimen  and confirmed on repeat testing.  While 2019 novel coronavirus  (SARS-CoV-2) nucleic acids may be present in the submitted sample  additional confirmatory testing may be necessary for epidemiological  and / or clinical management purposes  to differentiate between  SARS-CoV-2 and other Sarbecovirus currently known to infect humans.  If clinically indicated additional testing with an alternate test  methodology [phone]) is advised. The SARS-CoV-2 RNA is generally  detectable  in upper and lower respiratory sp ecimens during the acute  phase of infection. The expected result is Negative. Fact Sheet for Patients:  BoilerBrush.com.cy Fact Sheet for Healthcare Providers: https://pope.com/ This test is not yet approved or cleared by the [place] FDA and has been authorized for detection and/or diagnosis of SARS-CoV-2 by FDA under an Emergency Use Authorization (EUA).  This EUA will remain in effect (meaning this test can be used) for the duration of the COVID-19 declaration under Section 564(b)(1) of the Act, 21 U.S.C. section 360bbb-3(b)(1), unless the authorization is terminated or revoked sooner. Performed at [facility], [address] [place], [place] [address]   SARS CORONAVIRUS 2 (TAT 6-24 HRS) Nasopharyngeal Nasopharyngeal Swab     Status: None   Collection Time: 05/22/19  5:12 AM   Specimen: Nasopharyngeal Swab  Result Value Ref Range Status   SARS Coronavirus 2 NEGATIVE NEGATIVE Final    Comment: (NOTE) SARS-CoV-2 target nucleic acids are NOT DETECTED. The SARS-CoV-2 RNA is generally detectable in upper and lower respiratory specimens during the acute phase of infection. Negative results do not preclude SARS-CoV-2 infection, do not rule out co-infections with other pathogens, and should not be used as the sole basis for treatment or other patient management decisions. Negative results must be combined with clinical observations, patient history, and epidemiological information. The expected result is Negative. Fact Sheet for Patients: HairSlick.no Fact Sheet for Healthcare Providers: quierodirigir.com This test is not yet approved or cleared by the [place] FDA and  has been authorized for detection and/or diagnosis of SARS-CoV-2 by FDA under an Emergency Use Authorization (EUA). This EUA will remain  in effect (meaning this  test can be used) for the duration of the COVID-19 declaration under Section 56 4(b)(1) of the Act, 21 U.S.C. section 360bbb-3(b)(1), unless the authorization is terminated or revoked sooner. Performed at [facility], [address] [place], [place] [address]      Labs: BNP (last 3 results) No results for input(s): BNP in the last 8760 hours. Basic Metabolic Panel: Recent Labs  Lab 05/17/19 2101 05/18/19 0403 05/19/19 0103 05/20/19 0308 05/21/19 0650 05/22/19 0304  NA 138 139 137 138 136  --   K 5.2* 4.8 5.2* 4.6 4.8  --   CL 106 106 104 107 104  --   CO2 22 26 24 23 24   --   GLUCOSE 158* 156* 210* 122* 123*  --   BUN 27* 25* 26* 25* 13  --   CREATININE 1.23* 1.12* 1.57* 1.11* 0.86  --   CALCIUM 9.0 8.5* 8.1* 7.9* 8.3*  --   MG  --   --   --   --   --  2.0  PHOS  --   --   --   --   --  3.8   Liver Function Tests: Recent Labs  Lab 05/17/19 2101 05/18/19 0403  AST 19  --   ALT 19  --   ALKPHOS 49  --   BILITOT 1.4* 0.9  PROT 6.4*  --   ALBUMIN 4.1  --    No results for input(s): LIPASE, AMYLASE in the last 168 hours. No results for input(s): AMMONIA in the last 168 hours. CBC: Recent Labs  Lab 05/17/19 2101  05/19/19 0103 05/19/19 1533 05/20/19 0308 05/20/19 1830 05/21/19 0351 05/22/19 0304  WBC 6.3   < > 8.3 9.3 5.8  --  4.6 4.4  NEUTROABS 4.9  --   --  7.5  --   --   --   --   HGB 13.1   < > 8.3* 7.3* 6.0* 10.5* 9.9* 10.4*  HCT 37.2   < > 24.8* 20.8* 18.1* 29.8* 29.6* 31.1*  MCV 106.9*   < > 110.2* 110.1* 111.0*  --  100.7* 100.3*  PLT 104*   < > 85* 92* 73*  --  64* 86*   < > = values in this interval not displayed.   Cardiac Enzymes: No results for input(s): CKTOTAL, CKMB, CKMBINDEX, TROPONINI in the last 168 hours. BNP: Invalid input(s): POCBNP CBG: No results for input(s): GLUCAP in the last 168 hours. D-Dimer No results for input(s): DDIMER in the last 72 hours. Hgb A1c No results for input(s): HGBA1C in the last 72 hours. Lipid  Profile No results for input(s): CHOL, HDL, LDLCALC, TRIG, CHOLHDL, LDLDIRECT in the last 72 hours. Thyroid function studies Recent Labs    05/22/19 0304  TSH 6.181*   Anemia work up No results for input(s): VITAMINB12, FOLATE, FERRITIN, TIBC, IRON, RETICCTPCT in the last 72 hours. Urinalysis No results found for: COLORURINE, APPEARANCEUR, LABSPEC, PHURINE, GLUCOSEU, HGBUR, BILIRUBINUR, KETONESUR, PROTEINUR, UROBILINOGEN, NITRITE, LEUKOCYTESUR Sepsis Labs Invalid input(s): PROCALCITONIN,  WBC,  LACTICIDVEN Microbiology Recent Results (from the past 240 hour(s))  SARS CORONAVIRUS 2 (TAT 6-24 HRS) Nasopharyngeal Nasopharyngeal Swab     Status: None   Collection Time: 05/17/19 10:10 PM   Specimen: Nasopharyngeal Swab  Result Value Ref Range Status   SARS Coronavirus 2 NEGATIVE NEGATIVE Final    Comment: (NOTE) SARS-CoV-2 target nucleic acids are NOT DETECTED. The SARS-CoV-2 RNA is generally detectable in upper and lower respiratory specimens during the acute phase of infection. Negative results do not preclude SARS-CoV-2 infection, do not rule out co-infections with other pathogens, and should not be used as the sole basis for treatment or other patient management decisions. Negative results must be combined with clinical observations, patient history, and epidemiological information. The expected result is Negative. Fact Sheet for Patients: HairSlick.no Fact Sheet for Healthcare Providers: quierodirigir.com This test is not yet approved or cleared by the [place] FDA and  has been authorized for detection and/or diagnosis of SARS-CoV-2 by FDA under an Emergency Use Authorization (EUA). This EUA will remain  in effect (meaning this test can be used) for the duration of the COVID-19 declaration under Section 56 4(b)(1) of the Act, 21 U.S.C. section 360bbb-3(b)(1), unless the authorization is terminated or revoked  sooner. Performed at [facility], [address] [place], [place] [address]   SARS Coronavirus 2 [facility] order, Performed in [facility] hospital lab) Nasopharyngeal Nasopharyngeal Swab     Status: None   Collection Time: 05/18/19 10:00 AM  Specimen: Nasopharyngeal Swab  Result Value Ref Range Status   SARS Coronavirus 2 NEGATIVE NEGATIVE Final    Comment: (NOTE) If result is NEGATIVE SARS-CoV-2 target nucleic acids are NOT DETECTED. The SARS-CoV-2 RNA is generally detectable in upper and lower  respiratory specimens during the acute phase of infection. The lowest  concentration of SARS-CoV-2 viral copies this assay can detect is 250  copies / mL. A negative result does not preclude SARS-CoV-2 infection  and should not be used as the sole basis for treatment or other  patient management decisions.  A negative result may occur with  improper specimen collection / handling, submission of specimen other  than nasopharyngeal swab, presence of viral mutation(s) within the  areas targeted by this assay, and inadequate number of viral copies  (<250 copies / mL). A negative result must be combined with clinical  observations, patient history, and epidemiological information. If result is POSITIVE SARS-CoV-2 target nucleic acids are DETECTED. The SARS-CoV-2 RNA is generally detectable in upper and lower  respiratory specimens dur ing the acute phase of infection.  Positive  results are indicative of active infection with SARS-CoV-2.  Clinical  correlation with patient history and other diagnostic information is  necessary to determine patient infection status.  Positive results do  not rule out bacterial infection or co-infection with other viruses. If result is PRESUMPTIVE POSTIVE SARS-CoV-2 nucleic acids MAY BE PRESENT.   A presumptive positive result was obtained on the submitted specimen  and confirmed on repeat testing.  While 2019 novel coronavirus  (SARS-CoV-2) nucleic  acids may be present in the submitted sample  additional confirmatory testing may be necessary for epidemiological  and / or clinical management purposes  to differentiate between  SARS-CoV-2 and other Sarbecovirus currently known to infect humans.  If clinically indicated additional testing with an alternate test  methodology [phone]) is advised. The SARS-CoV-2 RNA is generally  detectable in upper and lower respiratory sp ecimens during the acute  phase of infection. The expected result is Negative. Fact Sheet for Patients:  StrictlyIdeas.no Fact Sheet for Healthcare Providers: BankingDealers.co.za This test is not yet approved or cleared by the [place] FDA and has been authorized for detection and/or diagnosis of SARS-CoV-2 by FDA under an Emergency Use Authorization (EUA).  This EUA will remain in effect (meaning this test can be used) for the duration of the COVID-19 declaration under Section 564(b)(1) of the Act, 21 U.S.C. section 360bbb-3(b)(1), unless the authorization is terminated or revoked sooner. Performed at [facility], [place] [address] [place], [place] [address]   SARS CORONAVIRUS 2 (TAT 6-24 HRS) Nasopharyngeal Nasopharyngeal Swab     Status: None   Collection Time: 05/22/19  5:12 AM   Specimen: Nasopharyngeal Swab  Result Value Ref Range Status   SARS Coronavirus 2 NEGATIVE NEGATIVE Final    Comment: (NOTE) SARS-CoV-2 target nucleic acids are NOT DETECTED. The SARS-CoV-2 RNA is generally detectable in upper and lower respiratory specimens during the acute phase of infection. Negative results do not preclude SARS-CoV-2 infection, do not rule out co-infections with other pathogens, and should not be used as the sole basis for treatment or other patient management decisions. Negative results must be combined with clinical observations, patient history, and epidemiological information. The expected result is  Negative. Fact Sheet for Patients: SugarRoll.be Fact Sheet for Healthcare Providers: https://www.woods-mathews.com/ This test is not yet approved or cleared by the [place] FDA and  has been authorized for detection and/or diagnosis of SARS-CoV-2  by FDA under an Emergency Use Authorization (EUA). This EUA will remain  in effect (meaning this test can be used) for the duration of the COVID-19 declaration under Section 56 4(b)(1) of the Act, 21 U.S.C. section 360bbb-3(b)(1), unless the authorization is terminated or revoked sooner. Performed at [facility], [address], [place], [place] [address]      Time coordinating discharge: 35 minutes  SIGNED:   Lanae Boast, MD  [facility] 05/22/2019, 12:04 PM  If 7PM-7AM, please contact night-coverage [email or web address]

## 2019-05-24 LAB — CALCIUM, IONIZED: Calcium, Ionized, Serum: 4.8 mg/dL (ref 4.5–5.6)

## 2019-06-03 LAB — PTH, INTACT AND CALCIUM: PTH: 58 pg/mL (ref 15–65)

## 2019-06-08 ENCOUNTER — Other Ambulatory Visit: Payer: Self-pay | Admitting: Orthopedic Surgery

## 2019-06-08 DIAGNOSIS — S72002Q Fracture of unspecified part of neck of left femur, subsequent encounter for open fracture type I or II with malunion: Secondary | ICD-10-CM

## 2019-10-05 ENCOUNTER — Ambulatory Visit
Admission: RE | Admit: 2019-10-05 | Discharge: 2019-10-05 | Disposition: A | Discharge status: Home / Self Care | Payer: Medicare Other | Source: Ambulatory Visit | Attending: Orthopedic Surgery | Admitting: Orthopedic Surgery

## 2019-10-05 ENCOUNTER — Other Ambulatory Visit: Payer: Self-pay

## 2019-10-05 DIAGNOSIS — S72002Q Fracture of unspecified part of neck of left femur, subsequent encounter for open fracture type I or II with malunion: Secondary | ICD-10-CM

## 2022-03-14 ENCOUNTER — Telehealth: Payer: Self-pay | Admitting: Oncology

## 2022-03-14 NOTE — Telephone Encounter (Signed)
Scheduled appt per 7/19 referral. Pt's son is aware of appt date and time. Pt's son is aware to arrive 15 mins prior to appt time and to bring and updated insurance card. Pt's son is aware of appt location.

## 2022-04-04 ENCOUNTER — Telehealth: Payer: Self-pay | Admitting: Oncology

## 2022-04-04 NOTE — Telephone Encounter (Signed)
R/s pt's new hem appt due to provider not being in network with pt's insurance. Called pt, no answer. Left msg with appt date/time. Requested for pt to call back to confirm new appt.

## 2022-04-12 ENCOUNTER — Encounter: Payer: Medicare Other | Admitting: Oncology

## 2022-04-12 ENCOUNTER — Inpatient Hospital Stay: Payer: Medicare Other | Admitting: Oncology

## 2022-04-25 ENCOUNTER — Other Ambulatory Visit: Payer: Self-pay

## 2022-04-25 ENCOUNTER — Inpatient Hospital Stay: Payer: Medicare Other | Attending: Oncology | Admitting: Oncology

## 2022-04-25 VITALS — BP 155/54 | HR 53 | Temp 97.6°F | Resp 17 | Ht 62.0 in | Wt 128.5 lb

## 2022-04-25 DIAGNOSIS — D696 Thrombocytopenia, unspecified: Secondary | ICD-10-CM | POA: Insufficient documentation

## 2022-04-25 NOTE — Progress Notes (Signed)
Hematology and Oncology Follow Up Visit  Virginia Wright [id number] 1933-02-22 88 y.o. 04/25/2022 10:53 AM Chilton Greathouse, NPSmith, [name], NP   Principle Diagnosis: 88 year old woman with thrombocytopenia diagnosed in 2014.  Her platelet count have ranged around 100,000 without any need for intervention.  Etiology likely reactive related to her medication including methotrexate versus early MDS.     Current therapy: Active surveillance  Interim History: Ms. Virginia Wright returns today for a follow-up.  She is a pleasant 88 year old woman seen in consultation previously for thrombocytopenia.  Her platelet count in the last 5 years has fluctuated between 82 and most recently was 107.  CBC on March 12, 2022 showed a white cell count of 2.7, hemoglobin of 12.  Her differential is normal.  Clinically, she reports no changes in her health.  She denies any bleeding or bruising.  She denies any recurrent infections.  She continues to be on methotrexate for her arthritis without any reexacerbation.    Medications: I have reviewed the patient's current medications.  Current Outpatient Medications  Medication Sig Dispense Refill   albuterol (PROVENTIL HFA;VENTOLIN HFA) 108 (90 Base) MCG/ACT inhaler Inhale 2 puffs into the lungs 4 (four) times daily as needed.     amLODipine (NORVASC) 5 MG tablet Take 1 tablet (5 mg total) by mouth daily. Hold for sbp <110     cholecalciferol (VITAMIN D) 25 MCG tablet Take 2 tablets (2,000 Units total) by mouth 2 (two) times daily.     docusate sodium (COLACE) 100 MG capsule Take 1 capsule (100 mg total) by mouth 2 (two) times daily. 10 capsule 0   enoxaparin (LOVENOX) 30 MG/0.3ML injection Inject 0.3 mLs (30 mg total) into the skin daily. Hold if platelet counts are below 75k and discuss with doctor/orthopedic for alternative dvt prophylaxis     feeding supplement, ENSURE ENLIVE, (ENSURE ENLIVE) LIQD Take 237 mLs by mouth daily at 3 pm. 237 mL 12   folic acid (FOLVITE) 1  MG tablet Take 1 tablet by mouth daily.     HYDROcodone-acetaminophen (NORCO/VICODIN) 5-325 MG tablet Take 1 tablet by mouth every 6 (six) hours as needed for up to 10 doses for severe pain (pain score 4-6). 10 tablet 0   levothyroxine (SYNTHROID) 50 MCG tablet Take 50 mcg by mouth daily before breakfast.     methotrexate (RHEUMATREX) 2.5 MG tablet Take 10 mg by mouth every Wednesday. Caution:Chemotherapy. Protect from light.      polyethylene glycol (MIRALAX / GLYCOLAX) 17 g packet Take 17 g by mouth daily as needed. 14 each 0   propranolol (INDERAL) 40 MG tablet TAKE ONE TABLET BY MOUTH TWICE DAILY (Patient taking differently: Take 40 mg by mouth 2 (two) times daily. ) 180 tablet 4   vitamin C (VITAMIN C) 500 MG tablet Take 1 tablet (500 mg total) by mouth daily.     No current facility-administered medications for this visit.     Allergies:  Allergies  Allergen Reactions   Celecoxib Other (See Comments)    Other reaction(s): SWELLING/EDEMA   Clarithromycin Other (See Comments)    Patient doesn't remember reaction   Cefdinir Rash   Codeine Nausea Only   Gatifloxacin Other (See Comments)    Patient doesn't remember reaction   Hydroxychloroquine Other (See Comments)    Patient doesn't remember reaction      Physical Exam: Blood pressure (!) 155/54, pulse (!) 53, temperature 97.6 F (36.4 C), temperature source Oral, resp. rate 17, height 5\' 2"  (1.575 m), weight  128 lb 8 oz (58.3 kg), SpO2 100 %.  ECOG: 2  General appearance: Comfortable appearing without any discomfort Head: Normocephalic without any trauma Oropharynx: Mucous membranes are moist and pink without any thrush or ulcers. Eyes: Pupils are equal and round reactive to light. Lymph nodes: No cervical, supraclavicular, inguinal or axillary lymphadenopathy.   Heart:regular rate and rhythm.  S1 and S2 without leg edema. Lung: Clear without any rhonchi or wheezes.  No dullness to percussion. Abdomin: Soft, nontender,  nondistended with good bowel sounds.  No hepatosplenomegaly. Musculoskeletal: No joint deformity or effusion.  Full range of motion noted. Neurological: No deficits noted on motor, sensory and deep tendon reflex exam. Skin: No petechial rash or dryness.  Appeared moist.     Lab Results: Lab Results  Component Value Date   WBC 4.4 05/22/2019   HGB 10.4 (L) 05/22/2019   HCT 31.1 (L) 05/22/2019   MCV 100.3 (H) 05/22/2019   PLT 86 (L) 05/22/2019     Chemistry      Component Value Date/Time   NA 136 05/21/2019 0650   K 4.8 05/21/2019 0650   CL 104 05/21/2019 0650   CO2 24 05/21/2019 0650   BUN 13 05/21/2019 0650   CREATININE 0.86 05/21/2019 0650      Component Value Date/Time   CALCIUM LA11 05/22/2019 0304   ALKPHOS 49 05/17/2019 2101   AST 19 05/17/2019 2101   ALT 19 05/17/2019 2101   BILITOT 0.9 05/18/2019 0403        [facility] Outside Information    Contains abnormal data CBC w/ Differential Specimen:  Blood  Ref Range & Units 2 mo ago  WBC 3.6 - 11.2 10*9/L 3.0 Low    RBC 3.95 - 5.13 10*12/L 3.53 Low    HGB 11.3 - 14.9 g/dL 87.5   HCT 64.3 - 32.9 % 36.8   MCV 77.6 - 95.7 fL 104.4 High    MCH 25.9 - 32.4 pg 35.7 High    MCHC 32.0 - 36.0 g/dL 51.8   RDW 84.1 - 66.0 % 16.2 High    MPV 6.8 - 10.7 fL 9.1   Platelet 150 - 450 10*9/L 69 Low    Resulting Agency  [facility]  Specimen Collected: 02/15/22 08:50   Performed by: [facility] Last Resulted: 02/15/22 12:20  Received From: [facility]  Result Received: 03/14/22 09:06   Recent Data from [facility] Related to CBC w/ Differential Component 03/12/22 02/15/22 02/15/22 07/12/21 02/16/21 11/13/20  WBC 2.7 Low  3.0 Low  -- 3.2 Low  3.3 Low  3.2 Low   RBC 3.42 Low  3.53 Low  -- 3.12 Low  3.16 Low  3.41 Low   HGB 12.3 12.6 -- 11.7 11.4 11.5  HCT 35.8 36.8 -- 33.8 Low  33.5 Low  35.1  MCV 104.8 High  104.4 High  -- 108.5 High  106.0 High  102.9 High   MCH  35.9 High  35.7 High  -- 37.6 High  36.1 High  33.7 High   MCHC 34.2 34.2 -- 34.6 34.0 32.8  RDW 16.1 High  16.2 High  -- 15.6 High  16.5 High  15.2  MPV 8.5 9.1 -- 8.8 8.7 10.1  Platelet 107 Low  69 Low  -- 72 Low  79 Low  82 Low   Absolute Eosinophils -- -- 0.1 0.1 0.1 0.1  Absolute Lymphocytes -- -- 0.4 Low  0.6 Low  0.5 Low  0.7  Absolute Neutrophils -- -- 2.5 2.2 2.2 2.2  View all related data      Impression and Plan:   88 year old woman with:   1. Thrombocytopenia with fluctuating counts dating back to 2014 and has been predominantly over 100.     Laboratory data in the last 5 years were personally reviewed and discussed with the patient today.  Her platelet count has fluctuated as high as 107 down to 69.  This pattern has been the case at least since 2014 without any signs or symptoms to suggest progressive cytopenia.  The differential diagnosis was reviewed and these findings are likely related to benign reactive findings versus methotrexate effect.  I do not see any evidence to suggest TTP, ITP or HUS.  MDS can be a possibility but likely does not require intervention.   2.  Leukocytopenia: Her white cell count is fluctuated as well with white cell count 2.7 and 3.3.  I do not see anything to suggest a neurological disorder at this time.  Surveillance and monitoring would be reasonable.  This could be related to methotrexate which could be adjusted in the future if any further drop is noted.   3. Follow-up: No further work-up will be required.  Recommended that follow-up as needed.  30  minutes were dedicated to this visit. The time was spent on reviewing laboratory data, discussing treatment options, discussing differential diagnosis and answering questions regarding future plan.    Eli Hose, MD 8/31/202310:53 AM
